# Patient Record
Sex: Female | Born: 1970 | Race: White | Hispanic: Yes | Smoking: Never smoker
Health system: Southern US, Community
[De-identification: ages and names within clinical notes are randomized; demographics above are authoritative.]

## PROBLEM LIST (undated history)

## (undated) DIAGNOSIS — Z9189 Other specified personal risk factors, not elsewhere classified: Secondary | ICD-10-CM

## (undated) DIAGNOSIS — R51 Headache: Secondary | ICD-10-CM

## (undated) DIAGNOSIS — B019 Varicella without complication: Secondary | ICD-10-CM

## (undated) DIAGNOSIS — Z789 Other specified health status: Secondary | ICD-10-CM

## (undated) DIAGNOSIS — T7840XA Allergy, unspecified, initial encounter: Secondary | ICD-10-CM

## (undated) DIAGNOSIS — IMO0001 Reserved for inherently not codable concepts without codable children: Secondary | ICD-10-CM

## (undated) DIAGNOSIS — D649 Anemia, unspecified: Secondary | ICD-10-CM

## (undated) DIAGNOSIS — K4090 Unilateral inguinal hernia, without obstruction or gangrene, not specified as recurrent: Secondary | ICD-10-CM

## (undated) HISTORY — DX: Varicella without complication: B01.9

## (undated) HISTORY — DX: Allergy, unspecified, initial encounter: T78.40XA

## (undated) HISTORY — DX: Other specified personal risk factors, not elsewhere classified: Z91.89

## (undated) HISTORY — PX: HERNIA REPAIR: SHX51

## (undated) HISTORY — PX: SHOULDER ARTHROSCOPY: SHX128

## (undated) HISTORY — PX: MANDIBLE FRACTURE SURGERY: SHX706

---

## 2000-09-04 ENCOUNTER — Other Ambulatory Visit: Admission: RE | Admit: 2000-09-04 | Discharge: 2000-09-04 | Payer: Self-pay | Admitting: Obstetrics and Gynecology

## 2008-09-19 ENCOUNTER — Inpatient Hospital Stay (HOSPITAL_COMMUNITY): Admission: AD | Admit: 2008-09-19 | Discharge: 2008-09-21 | Payer: Self-pay | Admitting: Obstetrics and Gynecology

## 2008-09-23 ENCOUNTER — Encounter: Admission: RE | Admit: 2008-09-23 | Discharge: 2008-10-22 | Payer: Self-pay | Admitting: Obstetrics

## 2008-10-23 ENCOUNTER — Encounter: Admission: RE | Admit: 2008-10-23 | Discharge: 2008-11-22 | Payer: Self-pay | Admitting: Obstetrics

## 2008-11-23 ENCOUNTER — Encounter: Admission: RE | Admit: 2008-11-23 | Discharge: 2008-12-22 | Payer: Self-pay | Admitting: Obstetrics

## 2008-12-23 ENCOUNTER — Encounter: Admission: RE | Admit: 2008-12-23 | Discharge: 2009-01-15 | Payer: Self-pay | Admitting: Obstetrics

## 2009-01-23 ENCOUNTER — Encounter: Admission: RE | Admit: 2009-01-23 | Discharge: 2009-02-22 | Payer: Self-pay | Admitting: Obstetrics

## 2009-02-23 ENCOUNTER — Encounter: Admission: RE | Admit: 2009-02-23 | Discharge: 2009-03-25 | Payer: Self-pay | Admitting: Obstetrics

## 2010-04-23 LAB — CBC
HCT: 41.2 % (ref 36.0–46.0)
Hemoglobin: 14 g/dL (ref 12.0–15.0)
MCHC: 34.1 g/dL (ref 30.0–36.0)
MCHC: 34.3 g/dL (ref 30.0–36.0)
MCV: 97.5 fL (ref 78.0–100.0)
RBC: 3.54 MIL/uL — ABNORMAL LOW (ref 3.87–5.11)
RDW: 13.8 % (ref 11.5–15.5)

## 2010-06-29 LAB — ABO/RH: RH Type: POSITIVE

## 2010-06-29 LAB — RUBELLA ANTIBODY, IGM: Rubella: IMMUNE

## 2010-06-29 LAB — HEPATITIS B SURFACE ANTIGEN: Hepatitis B Surface Ag: NEGATIVE

## 2010-06-29 LAB — ANTIBODY SCREEN: Antibody Screen: NEGATIVE

## 2011-01-04 LAB — STREP B DNA PROBE: GBS: NEGATIVE

## 2011-01-18 NOTE — L&D Delivery Note (Signed)
Delivery Note At 5:45 PM a viable female was delivered via Vaginal, Spontaneous Delivery (Presentation: Right Occiput Anterior).  APGAR: 9, 9; weight .   Placenta status: Intact, Spontaneous.  Cord: 3 vessels with the following complications: .  Cord pH: n/a  Anesthesia: Epidural  Episiotomy: none Lacerations: 2nd degree, external to hymenal ring Suture Repair: 2.0 vicryl rapide Est. Blood Loss (mL): 350 cc  Mom to postpartum.  Baby to nursery-stable.  Jerri Glauser A. 02/02/2011, 6:08 PM

## 2011-02-02 ENCOUNTER — Inpatient Hospital Stay (HOSPITAL_COMMUNITY)
Admission: AD | Admit: 2011-02-02 | Discharge: 2011-02-04 | DRG: 373 | Disposition: A | Discharge status: Home / Self Care | Payer: BC Managed Care – PPO | Source: Ambulatory Visit | Attending: Obstetrics and Gynecology | Admitting: Obstetrics and Gynecology

## 2011-02-02 ENCOUNTER — Inpatient Hospital Stay (HOSPITAL_COMMUNITY): Payer: BC Managed Care – PPO | Admitting: Anesthesiology

## 2011-02-02 ENCOUNTER — Encounter (HOSPITAL_COMMUNITY): Payer: Self-pay | Admitting: Anesthesiology

## 2011-02-02 ENCOUNTER — Encounter (HOSPITAL_COMMUNITY): Payer: Self-pay | Admitting: *Deleted

## 2011-02-02 DIAGNOSIS — IMO0001 Reserved for inherently not codable concepts without codable children: Secondary | ICD-10-CM

## 2011-02-02 DIAGNOSIS — O09529 Supervision of elderly multigravida, unspecified trimester: Secondary | ICD-10-CM | POA: Diagnosis present

## 2011-02-02 HISTORY — DX: Reserved for inherently not codable concepts without codable children: IMO0001

## 2011-02-02 HISTORY — DX: Other specified health status: Z78.9

## 2011-02-02 LAB — CBC
Hemoglobin: 14 g/dL (ref 12.0–15.0)
RBC: 4.38 MIL/uL (ref 3.87–5.11)
WBC: 10 10*3/uL (ref 4.0–10.5)

## 2011-02-02 LAB — TYPE AND SCREEN
ABO/RH(D): O POS
Antibody Screen: NEGATIVE

## 2011-02-02 LAB — ABO/RH: ABO/RH(D): O POS

## 2011-02-02 MED ORDER — ONDANSETRON HCL 4 MG/2ML IJ SOLN: 4.0000 mg | Freq: Four times a day (QID) | INTRAMUSCULAR | Status: DC | PRN

## 2011-02-02 MED ORDER — ONDANSETRON HCL 4 MG/2ML IJ SOLN: 4.0000 mg | INTRAMUSCULAR | Status: DC | PRN

## 2011-02-02 MED ORDER — PRENATAL MULTIVITAMIN CH
1.0000 | ORAL_TABLET | Freq: Every day | ORAL | Status: DC
  Administered 2011-02-04: 1 via ORAL
  Filled 2011-02-02 (×2): qty 1

## 2011-02-02 MED ORDER — CITRIC ACID-SODIUM CITRATE 334-500 MG/5ML PO SOLN: 30.0000 mL | ORAL | Status: DC | PRN

## 2011-02-02 MED ORDER — SIMETHICONE 80 MG PO CHEW: 80.0000 mg | CHEWABLE_TABLET | ORAL | Status: DC | PRN

## 2011-02-02 MED ORDER — BISACODYL 10 MG RE SUPP: 10.0000 mg | Freq: Every day | RECTAL | Status: DC | PRN

## 2011-02-02 MED ORDER — IBUPROFEN 600 MG PO TABS
600.0000 mg | ORAL_TABLET | Freq: Four times a day (QID) | ORAL | Status: DC
  Administered 2011-02-02 – 2011-02-04 (×7): 600 mg via ORAL
  Filled 2011-02-02 (×7): qty 1

## 2011-02-02 MED ORDER — EPHEDRINE 5 MG/ML INJ
10.0000 mg | INTRAVENOUS | Status: DC | PRN
  Filled 2011-02-02: qty 4

## 2011-02-02 MED ORDER — FLEET ENEMA 7-19 GM/118ML RE ENEM: 1.0000 | ENEMA | RECTAL | Status: DC | PRN

## 2011-02-02 MED ORDER — WITCH HAZEL-GLYCERIN EX PADS
1.0000 "application " | MEDICATED_PAD | CUTANEOUS | Status: DC | PRN
  Administered 2011-02-02: 1 via TOPICAL

## 2011-02-02 MED ORDER — LIDOCAINE HCL (PF) 1 % IJ SOLN: 30.0000 mL | INTRAMUSCULAR | Status: DC | PRN

## 2011-02-02 MED ORDER — LANOLIN HYDROUS EX OINT: TOPICAL_OINTMENT | CUTANEOUS | Status: DC | PRN

## 2011-02-02 MED ORDER — ONDANSETRON HCL 4 MG PO TABS: 4.0000 mg | ORAL_TABLET | ORAL | Status: DC | PRN

## 2011-02-02 MED ORDER — TETANUS-DIPHTH-ACELL PERTUSSIS 5-2.5-18.5 LF-MCG/0.5 IM SUSP: 0.5000 mL | Freq: Once | INTRAMUSCULAR | Status: DC

## 2011-02-02 MED ORDER — ZOLPIDEM TARTRATE 5 MG PO TABS: 5.0000 mg | ORAL_TABLET | Freq: Every evening | ORAL | Status: DC | PRN

## 2011-02-02 MED ORDER — LACTATED RINGERS IV SOLN: INTRAVENOUS | Status: DC

## 2011-02-02 MED ORDER — OXYCODONE-ACETAMINOPHEN 5-325 MG PO TABS: 2.0000 | ORAL_TABLET | ORAL | Status: DC | PRN

## 2011-02-02 MED ORDER — EPHEDRINE 5 MG/ML INJ: 10.0000 mg | INTRAVENOUS | Status: DC | PRN

## 2011-02-02 MED ORDER — DIPHENHYDRAMINE HCL 50 MG/ML IJ SOLN: 12.5000 mg | INTRAMUSCULAR | Status: DC | PRN

## 2011-02-02 MED ORDER — LACTATED RINGERS IV SOLN
500.0000 mL | Freq: Once | INTRAVENOUS | Status: AC
  Administered 2011-02-02: 1000 mL via INTRAVENOUS

## 2011-02-02 MED ORDER — OXYTOCIN 20 UNITS IN LACTATED RINGERS INFUSION - SIMPLE: 125.0000 mL/h | Freq: Once | INTRAVENOUS | Status: DC

## 2011-02-02 MED ORDER — LACTATED RINGERS IV SOLN: 500.0000 mL | INTRAVENOUS | Status: DC | PRN

## 2011-02-02 MED ORDER — SENNOSIDES-DOCUSATE SODIUM 8.6-50 MG PO TABS
2.0000 | ORAL_TABLET | Freq: Every day | ORAL | Status: DC
  Administered 2011-02-03: 2 via ORAL

## 2011-02-02 MED ORDER — DIPHENHYDRAMINE HCL 25 MG PO CAPS: 25.0000 mg | ORAL_CAPSULE | Freq: Four times a day (QID) | ORAL | Status: DC | PRN

## 2011-02-02 MED ORDER — IBUPROFEN 600 MG PO TABS: 600.0000 mg | ORAL_TABLET | Freq: Four times a day (QID) | ORAL | Status: DC | PRN

## 2011-02-02 MED ORDER — OXYCODONE-ACETAMINOPHEN 5-325 MG PO TABS
1.0000 | ORAL_TABLET | ORAL | Status: DC | PRN
  Filled 2011-02-02: qty 1

## 2011-02-02 MED ORDER — FLEET ENEMA 7-19 GM/118ML RE ENEM: 1.0000 | ENEMA | Freq: Every day | RECTAL | Status: DC | PRN

## 2011-02-02 MED ORDER — FENTANYL 2.5 MCG/ML BUPIVACAINE 1/10 % EPIDURAL INFUSION (WH - ANES)
14.0000 mL/h | INTRAMUSCULAR | Status: DC
  Filled 2011-02-02: qty 60

## 2011-02-02 MED ORDER — BENZOCAINE-MENTHOL 20-0.5 % EX AERO
1.0000 "application " | INHALATION_SPRAY | CUTANEOUS | Status: DC | PRN
  Administered 2011-02-03: 1 via TOPICAL

## 2011-02-02 MED ORDER — DIBUCAINE 1 % RE OINT: 1.0000 "application " | TOPICAL_OINTMENT | RECTAL | Status: DC | PRN

## 2011-02-02 MED ORDER — BENZOCAINE-MENTHOL 20-0.5 % EX AERO
INHALATION_SPRAY | CUTANEOUS | Status: AC
  Administered 2011-02-03: 1 via TOPICAL
  Filled 2011-02-02: qty 56

## 2011-02-02 MED ORDER — PHENYLEPHRINE 40 MCG/ML (10ML) SYRINGE FOR IV PUSH (FOR BLOOD PRESSURE SUPPORT)
80.0000 ug | PREFILLED_SYRINGE | INTRAVENOUS | Status: DC | PRN
  Filled 2011-02-02: qty 5

## 2011-02-02 MED ORDER — SODIUM BICARBONATE 8.4 % IV SOLN
INTRAVENOUS | Status: DC | PRN
  Administered 2011-02-02: 4 mL via EPIDURAL

## 2011-02-02 MED ORDER — PHENYLEPHRINE 40 MCG/ML (10ML) SYRINGE FOR IV PUSH (FOR BLOOD PRESSURE SUPPORT): 80.0000 ug | PREFILLED_SYRINGE | INTRAVENOUS | Status: DC | PRN

## 2011-02-02 MED ORDER — FENTANYL 2.5 MCG/ML BUPIVACAINE 1/10 % EPIDURAL INFUSION (WH - ANES)
INTRAMUSCULAR | Status: DC | PRN
  Administered 2011-02-02: 14 mL/h via EPIDURAL

## 2011-02-02 MED ORDER — OXYTOCIN BOLUS FROM INFUSION
500.0000 mL | Freq: Once | INTRAVENOUS | Status: AC
  Administered 2011-02-02: 500 mL via INTRAVENOUS
  Filled 2011-02-02: qty 500
  Filled 2011-02-02: qty 1000

## 2011-02-02 MED ORDER — ACETAMINOPHEN 325 MG PO TABS: 650.0000 mg | ORAL_TABLET | ORAL | Status: DC | PRN

## 2011-02-02 NOTE — H&P (Signed)
Rebecca Key is a 41 y.o. G2P1001 at [redacted]w[redacted]d presenting for labor. Pt notes onset contractions around 1:30 pm. Good fetal movement, No vaginal bleeding, not leaking fluid. Pt seen in office and was 7 cm and sent for direct admit.  [facility] at [facility] since 8 wks - AMA, nl NT, nl AFP - h/o rapid labor- 5 hrs as primip - PT cervical change - GBS neg - DS 135  OB History    Grav Para Term Preterm Abortions TAB SAB Ect Mult Living   2 1 1       1      Past Medical History  Diagnosis Date  . No pertinent past medical history    Past Surgical History  Procedure Date  . Shoulder arthroscopy   . Mandible fracture surgery   . Hernia repair   abdominal hernia repair at 41 yrs old.  Family History: family history is not on file. Social History:  reports that she has never smoked. She has never used smokeless tobacco. She reports that she does not drink alcohol or use illicit drugs.  Review of Systems - Negative except contractions     Height 5\' 7"  (1.702 m), weight 77.111 kg (170 lb).  Physical Exam:  Gen: well appearing, no distress CV: RRR Pulm: CTAB Back: no CVAT Abd: gravid, NT, no RUQ pain LE: tr edema, equal bilaterally, non-tender Toco: q 4 min FH: baseline 120s, accelerations present, no deceleratons, 10 beat variability Cvx: 7 cm/ vtx in office at 2:30 per RT  Prenatal labs: ABO, Rh: O/Positive/-- (06/12 0000) Antibody: Negative (06/12 0000) Rubella:  immune RPR: Nonreactive (06/12 0000)  HBsAg: Negative (06/12 0000)  HIV: Non-reactive (06/12 0000)  GBS: Negative (12/18 0000)  1 hr Glucola 135  Genetic screening neg (nl NT, nl AFP) Anatomy US normal   Assessment/Plan: 41 y.o. G2P1001 at [redacted]w[redacted]d Labor- expectant management at this time GBS neg Reactive fetal testing Undecided about epidural Planning cord blood donation  Rebecca Key A. 02/02/2011 3:23 PM     Rebecca Key A. 02/02/2011, 3:19 PM

## 2011-02-02 NOTE — Anesthesia Preprocedure Evaluation (Signed)

## 2011-02-02 NOTE — Anesthesia Procedure Notes (Signed)

## 2011-02-03 ENCOUNTER — Encounter (HOSPITAL_COMMUNITY): Payer: Self-pay

## 2011-02-03 LAB — CBC
Hemoglobin: 11.2 g/dL — ABNORMAL LOW (ref 12.0–15.0)
MCH: 31.6 pg (ref 26.0–34.0)
MCHC: 33.9 g/dL (ref 30.0–36.0)
Platelets: 116 10*3/uL — ABNORMAL LOW (ref 150–400)
RBC: 3.54 MIL/uL — ABNORMAL LOW (ref 3.87–5.11)

## 2011-02-03 LAB — RPR: RPR Ser Ql: NONREACTIVE

## 2011-02-03 NOTE — Progress Notes (Signed)
PPD# 1   S: + amb, + void, min lochia, pain controlled, tol po, overall well  O:  Filed Vitals:   02/02/11 2020 02/02/11 2130 02/03/11 0130 02/03/11 0524  BP: 111/78 109/69 102/64 103/63  Pulse: 62 63 64 60  Temp: 98 F (36.7 C) 98.1 F (36.7 C) 97.8 F (36.6 C) 98.3 F (36.8 C)  TempSrc:    Oral  Resp: 20 20 18 18   Height:      Weight:      SpO2:       Gen: well appearing, CV: RRR Pulm: CTAB Abd: soft, NT, ND, fundus below umbilicus LE: NT, no edema  CBC    Component Value Date/Time   WBC 11.2* 02/03/2011 0652   RBC 3.54* 02/03/2011 0652   HGB 11.2* 02/03/2011 0652   HCT 33.0* 02/03/2011 0652   PLT 116* 02/03/2011 0652   MCV 93.2 02/03/2011 0652   MCH 31.6 02/03/2011 0652   MCHC 33.9 02/03/2011 0652   RDW 13.3 02/03/2011 0652    A/P: PPD # 1 - doing well - routine PP care - circ this pm  Pinki Rottman A. 02/03/2011 10:21 AM

## 2011-02-04 MED ORDER — IBUPROFEN 600 MG PO TABS: 600.0000 mg | ORAL_TABLET | Freq: Four times a day (QID) | ORAL | Status: AC

## 2011-02-04 MED ORDER — OXYCODONE-ACETAMINOPHEN 5-325 MG PO TABS: 1.0000 | ORAL_TABLET | Freq: Four times a day (QID) | ORAL | Status: AC | PRN

## 2011-02-04 NOTE — Discharge Summary (Signed)
Obstetric Discharge Summary Reason for Admission: onset of labor and term gestation at 102wks Prenatal Procedures: ultrasound Intrapartum Procedures: spontaneous vaginal delivery Postpartum Procedures: none Complications-Operative and Postpartum: 2nd degree perineal laceration Hemoglobin  Date Value Range Status  02/03/2011 11.2* 12.0-15.0 (g/dL) Final     DELTA CHECK NOTED     REPEATED TO VERIFY     HCT  Date Value Range Status  02/03/2011 33.0* 36.0-46.0 (%) Final    Discharge Diagnoses: Term Pregnancy-delivered  Discharge Information: Date: 02/04/2011 Activity: pelvic rest Diet: routine Medications: Ibuprofen and Percocet Condition: stable Instructions: refer to practice specific booklet Discharge to: home   Newborn Data: Live born female on 02/02/11 Birth Weight: 8 lb 1.6 oz (3675 g) APGAR: 9, 9  Home with mother.  [name] K 02/04/2011, 9:43 AM

## 2011-02-04 NOTE — Progress Notes (Signed)
Patient ID: Rebecca Key, female   DOB: 09/10/1970, 41 y.o.   MRN: [id number] PPD # 2  Subjective: Pt reports feeling well and desires d/c home/ Pain controlled with prescription NSAID's including motrin Tolerating po/ Voiding without problems/ No n/v Bleeding is light/ Newborn info:  Information for the patient's newborn:  Dezaria, Methot [[id number]]  female  / circ complete, per Dr Mody/ Feeding: breast    Objective:  VS: Blood pressure 103/56, pulse 65, temperature 98 F (36.7 C), temperature source Oral, resp. rate 17.    Basename 02/03/11 0652 02/02/11 1507  WBC 11.2* 10.0  HGB 11.2* 14.0  HCT 33.0* 40.4  PLT 116* 149*    Blood type: --/--/O POS, O POS (01/16 1556) Rubella: Immune (06/12 0000)    Physical Exam:  General: alert, cooperative and no distress Abdomen: soft, nontender, normal bowel sounds Uterine Fundus: firm, below umbilicus, nontender Perineum: not inspected Lochia: minimal Ext: Homans sign is negative, no sign of DVT and no edema, redness or tenderness in the calves or thighs   A/P: PPD # 2/ [id number] Doing well and stable for discharge home RX: Ibuprofen 600mg  po Q 6 hrs prn pain #30 Refill x 1 Percocet 5/325 1 to 2 po Q 4 hrs prn pain #15 No refill WOB/GYN booklet given Routine pp visit in 6wks  Signed: Arlana Lindau [facility] 02/04/11 0930

## 2011-02-04 NOTE — Progress Notes (Signed)
Pt felt that baby was not getting enough from the breastfeeding and requested to supplement. Pt was given Enfamil and I monitored mother while she fed infant 15 ml of formula.

## 2011-02-07 NOTE — Addendum Note (Signed)
Addendum  created 02/07/11 0804 by Kady Toothaker Edward Valentino Saavedra, MD   Modules edited:Notes Section    

## 2011-02-07 NOTE — Anesthesia Postprocedure Evaluation (Signed)
  Anesthesia Post-op Note  Patient: Rebecca Key  Procedure(s) Performed: * No procedures listed * Patient is awake and responsive. Pain and nausea are reasonably well controlled. Vital signs are stable and clinically acceptable. Oxygen saturation is clinically acceptable. There are no apparent anesthetic complications at this time. Patient is ready for discharge.

## 2011-12-09 ENCOUNTER — Ambulatory Visit (INDEPENDENT_AMBULATORY_CARE_PROVIDER_SITE_OTHER): Payer: BC Managed Care – PPO | Admitting: Family Medicine

## 2011-12-09 VITALS — BP 107/67 | HR 69 | Temp 98.0°F | Resp 16 | Ht 66.0 in | Wt 158.0 lb

## 2011-12-09 DIAGNOSIS — Z0000 Encounter for general adult medical examination without abnormal findings: Secondary | ICD-10-CM

## 2011-12-09 DIAGNOSIS — Z23 Encounter for immunization: Secondary | ICD-10-CM

## 2011-12-09 LAB — POCT CBC
Granulocyte percent: 61.3 %G (ref 37–80)
HCT, POC: 44.2 % (ref 37.7–47.9)
MCV: 94.5 fL (ref 80–97)
POC Granulocyte: 4.2 (ref 2–6.9)
Platelet Count, POC: 227 10*3/uL (ref 142–424)
RBC: 4.68 M/uL (ref 4.04–5.48)

## 2011-12-09 NOTE — Patient Instructions (Addendum)
I will be in touch with you regarding your labs.  Let me know if you back is getting worse, and try to do "superman" exercises a couple of times a day.    Take care and congratulations!

## 2011-12-09 NOTE — Progress Notes (Signed)
[facility] [address], [place] [address] [phone]  Date:  12/09/2011   Name:  Rebecca Key   DOB:  05-02-1970   MRN:  [id number]  PCP:  No primary provider on file.    Chief Complaint: Annual Exam   History of Present Illness:  Rebecca Key is a 41 y.o. very pleasant female patient who presents with the following:  Here today for a CPE.  She delivered a baby boy in January of this year.  She also has a 17 year old son. Her children are doing very well. She does not have any particular concerns today but thought it was time to have a CPE as her mother has had some health problems lately. Her only symptom is some lower back pain which she attributes to carrying her son who weights about 22 lbs already! She is not fasting today   She does not need a pap or breast exam today.    She is no longer nursing.  She did have a Tdap during her pregnancy.    Patient Active Problem List  Diagnosis  . PP care - s/p SVD 1/16    Past Medical History  Diagnosis Date  . No pertinent past medical history   . Active labor 02/02/2011  . PP care - s/p SVD 1/16 02/03/2011  . Allergy     Past Surgical History  Procedure Date  . Shoulder arthroscopy   . Mandible fracture surgery   . Hernia repair     History  Substance Use Topics  . Smoking status: Never Smoker   . Smokeless tobacco: Never Used  . Alcohol Use: No    Family History  Problem Relation Age of Onset  . Heart disease Mother   . Stroke Mother   . Hyperlipidemia Father     Allergies  Allergen Reactions  . Sulfa Antibiotics Swelling    Medication list has been reviewed and updated.  Current Outpatient Prescriptions on File Prior to Visit  Medication Sig Dispense Refill  . levonorgestrel (MIRENA) 20 MCG/24HR IUD 1 each by Intrauterine route once.      . Prenatal Vit-Fe Fumarate-FA (PRENATAL MULTIVITAMIN) TABS Take 1 tablet by mouth daily.        Review of Systems:  As per HPI-  otherwise negative.   Physical Examination: Filed Vitals:   12/09/11 1239  BP: 107/67  Pulse: 69  Temp: 98 F (36.7 C)  Resp: 16   Filed Vitals:   12/09/11 1239  Height: 5\' 6"  (1.676 m)  Weight: 158 lb (71.668 kg)   Body mass index is 25.50 kg/(m^2). Ideal Body Weight: Weight in (lb) to have BMI = 25: 154.6   GEN: WDWN, NAD, Non-toxic, A & O x 3 HEENT: Atraumatic, Normocephalic. Neck supple. No masses, No LAD.  Bilateral TM wnl, oropharynx normal.  PEERL,EOMI.   Ears and Nose: No external deformity. CV: RRR, No M/G/R. No JVD. No thrill. No extra heart sounds. PULM: CTA B, no wheezes, crackles, rhonchi. No retractions. No resp. distress. No accessory muscle use. ABD: S, NT, ND, +BS. No rebound. No HSM. She has mild tenderness of her lower back muscles bilaterally, but normal flexion and extension.   EXTR: No c/c/e. Normal DTR all extremities NEURO Normal gait.  PSYCH: Normally interactive. Conversant. Not depressed or anxious appearing.  Calm demeanor.    Assessment and Plan: 1. Physical exam, annual  POCT CBC, Comprehensive metabolic panel, TSH, LDL cholesterol, direct, Flu vaccine  greater than or equal to 3yo preservative free IM   Normal PE today.  Flu shot.  Will plan further follow- up pending labs.  MSK lower back pain due to carrying her son on her hip and deconditioning.  See patient instructions for more details.      Abbe Amsterdam, MD

## 2011-12-10 ENCOUNTER — Encounter: Payer: Self-pay | Admitting: Family Medicine

## 2011-12-10 LAB — COMPREHENSIVE METABOLIC PANEL
AST: 15 U/L (ref 0–37)
Albumin: 4.3 g/dL (ref 3.5–5.2)
BUN: 9 mg/dL (ref 6–23)
Calcium: 9 mg/dL (ref 8.4–10.5)
Chloride: 105 mEq/L (ref 96–112)
Glucose, Bld: 79 mg/dL (ref 70–99)
Potassium: 4.2 mEq/L (ref 3.5–5.3)
Sodium: 140 mEq/L (ref 135–145)
Total Protein: 6.8 g/dL (ref 6.0–8.3)

## 2012-01-18 HISTORY — PX: VAGINAL PROLAPSE REPAIR: SHX830

## 2012-02-21 ENCOUNTER — Ambulatory Visit: Payer: BC Managed Care – PPO

## 2012-02-21 ENCOUNTER — Ambulatory Visit (INDEPENDENT_AMBULATORY_CARE_PROVIDER_SITE_OTHER): Payer: BC Managed Care – PPO | Admitting: Family Medicine

## 2012-02-21 DIAGNOSIS — M549 Dorsalgia, unspecified: Secondary | ICD-10-CM
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
## 2012-07-02 ENCOUNTER — Ambulatory Visit (INDEPENDENT_AMBULATORY_CARE_PROVIDER_SITE_OTHER): Payer: BC Managed Care – PPO | Admitting: Internal Medicine

## 2012-07-02 VITALS — BP 111/70 | HR 71 | Temp 98.0°F | Resp 16 | Ht 67.0 in | Wt 158.0 lb

## 2012-07-02 DIAGNOSIS — W57XXXA Bitten or stung by nonvenomous insect and other nonvenomous arthropods, initial encounter: Secondary | ICD-10-CM

## 2012-07-02 DIAGNOSIS — T148 Other injury of unspecified body region: Secondary | ICD-10-CM

## 2012-07-02 MED ORDER — FLUOCINONIDE-E 0.05 % EX CREA: TOPICAL_CREAM | Freq: Two times a day (BID) | CUTANEOUS | Status: DC

## 2012-07-02 NOTE — Progress Notes (Signed)
  Subjective:    Patient ID: Rebecca Key, female    DOB: 1970/05/26, 42 y.o.   MRN: [id number]  HPI"bites"? Over the past week has had several random mosquito bites or other insect bites and that lead to a  skin reaction that is more extensive than her past experience Unsure what kind--household members are all ok  Patient Active Problem List   Diagnosis Date Noted  . PP care - s/p SVD 1/16 02/03/2011  infant at home   Review of Systems     Objective:   Physical Exam BP 111/70  Pulse 71  Temp(Src) 98 F (36.7 C)  Resp 16  Ht 5\' 7"  (1.702 m)  Wt 158 lb (71.668 kg)  BMI 24.74 kg/m2  Breastfeeding? No There are several erythematous papules on arms that range in reaction size from millimeters to 3 cm No signs of cellulitis Several small bites on under her right posterior bra strap       Assessment & Plan:  Insect bites with allergic reaction  Lidex/avoidance

## 2012-07-17 ENCOUNTER — Other Ambulatory Visit: Payer: Self-pay | Admitting: Radiology

## 2012-07-17 ENCOUNTER — Other Ambulatory Visit: Payer: Self-pay | Admitting: Internal Medicine

## 2012-07-17 NOTE — Telephone Encounter (Signed)
Patient has had flea bites, is trying to get rid of the fleas, but would like refill on the cream lidex/ pended please advise

## 2013-01-25 ENCOUNTER — Ambulatory Visit (INDEPENDENT_AMBULATORY_CARE_PROVIDER_SITE_OTHER): Payer: BC Managed Care – PPO | Admitting: Physician Assistant

## 2013-01-25 VITALS — BP 112/76 | HR 71 | Temp 98.0°F | Resp 16 | Ht 66.0 in | Wt 158.0 lb

## 2013-01-25 DIAGNOSIS — J329 Chronic sinusitis, unspecified: Secondary | ICD-10-CM

## 2013-01-25 DIAGNOSIS — J3489 Other specified disorders of nose and nasal sinuses: Secondary | ICD-10-CM

## 2013-01-25 DIAGNOSIS — R0981 Nasal congestion: Secondary | ICD-10-CM

## 2013-01-25 MED ORDER — GUAIFENESIN ER 1200 MG PO TB12: 1.0000 | ORAL_TABLET | Freq: Two times a day (BID) | ORAL | Status: DC | PRN

## 2013-01-25 MED ORDER — IPRATROPIUM BROMIDE 0.03 % NA SOLN: 2.0000 | Freq: Two times a day (BID) | NASAL | Status: DC

## 2013-01-25 MED ORDER — AMOXICILLIN-POT CLAVULANATE 875-125 MG PO TABS: 1.0000 | ORAL_TABLET | Freq: Two times a day (BID) | ORAL | Status: DC

## 2013-01-25 NOTE — Progress Notes (Signed)
   Subjective:    Patient ID: Rebecca Key, female    DOB: 08/30/1970, [age] y.o.   MRN: [id number]  HPI 43 year old female presents for evaluation of possible sinus infection.  States symptoms have been going on for about 10 days and have been progressively getting worse.  Admits to sinus pain on the left side of her face that radiates to her left ear and is also causing all of her upper teeth on that side to hurt.  Admits to intermittent episodes of yellow/green nasal discharge, but often has times where she is unable to get any out.  She has been taking ibuprofen which does help some with the pain. Denies fever, chills, nausea, vomiting, headache, sore throat, or cough.  Patient is otherwise doing well with no other concerns today.     Review of Systems  Constitutional: Negative for fever and chills.  HENT: Positive for congestion and ear pain (left side). Negative for postnasal drip, rhinorrhea and sore throat.   Respiratory: Negative for cough.   Gastrointestinal: Negative for nausea and vomiting.  Neurological: Negative for headaches.       Objective:   Physical Exam  Constitutional: She is oriented to person, place, and time. She appears well-developed and well-nourished.  HENT:  Head: Normocephalic and atraumatic.  Right Ear: Hearing, tympanic membrane, external ear and ear canal normal.  Left Ear: Hearing, tympanic membrane, external ear and ear canal normal.  Nose: Right sinus exhibits no maxillary sinus tenderness and no frontal sinus tenderness. Left sinus exhibits maxillary sinus tenderness. Left sinus exhibits no frontal sinus tenderness.  Mouth/Throat: Uvula is midline, oropharynx is clear and moist and mucous membranes are normal. No oropharyngeal exudate.  Eyes: Conjunctivae are normal.  Neck: Normal range of motion. Neck supple.  Cardiovascular: Normal rate and normal heart sounds.   Pulmonary/Chest: Effort normal and breath sounds normal.  Lymphadenopathy:    She  has no cervical adenopathy.  Neurological: She is alert and oriented to person, place, and time.  Psychiatric: She has a normal mood and affect. Her behavior is normal. Judgment and thought content normal.          Assessment & Plan:  Sinus infection - Plan: amoxicillin-clavulanate (AUGMENTIN) 875-125 MG per tablet  Nasal congestion - Plan: ipratropium (ATROVENT) 0.03 % nasal spray, Guaifenesin (MUCINEX MAXIMUM STRENGTH) 1200 MG TB12  Will treat with Augmentin 875 mg bid x 10 days Atrovent NS twice daily to help with congestion and ETD Increase fluids and rest Mucinex 1200 mg twice daily Follow up if symptoms worsen or fail to improve.

## 2013-02-01 ENCOUNTER — Telehealth: Payer: Self-pay

## 2013-02-01 ENCOUNTER — Ambulatory Visit (INDEPENDENT_AMBULATORY_CARE_PROVIDER_SITE_OTHER): Payer: BC Managed Care – PPO | Admitting: Family Medicine

## 2013-02-01 VITALS — BP 102/70 | HR 86 | Temp 98.1°F | Resp 18 | Ht 66.0 in | Wt 159.0 lb

## 2013-02-01 DIAGNOSIS — J3489 Other specified disorders of nose and nasal sinuses: Secondary | ICD-10-CM

## 2013-02-01 DIAGNOSIS — R0981 Nasal congestion: Secondary | ICD-10-CM

## 2013-02-01 DIAGNOSIS — R52 Pain, unspecified: Secondary | ICD-10-CM

## 2013-02-01 LAB — POCT INFLUENZA A/B
INFLUENZA B, POC: NEGATIVE
Influenza A, POC: NEGATIVE

## 2013-02-01 MED ORDER — GUAIFENESIN ER 600 MG PO TB12: 600.0000 mg | ORAL_TABLET | Freq: Two times a day (BID) | ORAL | Status: DC

## 2013-02-01 NOTE — Progress Notes (Signed)
[facility] [address], [place] [address] [phone]  Date:  02/01/2013   Name:  Rebecca Key   DOB:  12/07/1970   MRN:  [id number]  PCP:  Robley FriesMODY,VAISHALI R, MD    Chief Complaint: Follow-up   History of Present Illness:  Rebecca Key is a 43 y.o. very pleasant female patient who presents with the following:  She was here 1 week ago with x of a possible sinus infection.  She was treated with augmentin 875 BID for 10 days.   She has been taking this regularly but last night she noted chills. She did not have a fever when she checked her temp.  She has also noted some body aches. She does not have a cough.   She has noted more sinus drainage over the last couple of days. She is not sure if her sinus infection is getting better as it should.  No GI symptoms She is taking advil- just took some a little while ago She had noted tooth pain; this is now a bit better She has noted a headache in her teeth and cheeks- it is now more in her forehead.   She has an IUD She did get a flu shot this year.   She is generally quite healthy.    Patient Active Problem List   Diagnosis Date Noted  . PP care - s/p SVD 1/16 02/03/2011    Past Medical History  Diagnosis Date  . No pertinent past medical history   . Active labor 02/02/2011  . PP care - s/p SVD 1/16 02/03/2011  . Allergy     Past Surgical History  Procedure Laterality Date  . Shoulder arthroscopy    . Mandible fracture surgery    . Hernia repair      History  Substance Use Topics  . Smoking status: Never Smoker   . Smokeless tobacco: Never Used  . Alcohol Use: No    Family History  Problem Relation Age of Onset  . Heart disease Mother   . Stroke Mother   . Hyperlipidemia Father     Allergies  Allergen Reactions  . Sulfa Antibiotics Swelling    Medication list has been reviewed and updated.  Current Outpatient Prescriptions on File Prior to Visit  Medication Sig Dispense  Refill  . amoxicillin-clavulanate (AUGMENTIN) 875-125 MG per tablet Take 1 tablet by mouth 2 (two) times daily.  20 tablet  0  . ipratropium (ATROVENT) 0.03 % nasal spray Place 2 sprays into the nose 2 (two) times daily.  30 mL  0  . levonorgestrel (MIRENA) 20 MCG/24HR IUD 1 each by Intrauterine route once.      . fluocinonide-emollient (LIDEX-E) 0.05 % cream Apply topically 2 (two) times daily.  30 g  0  . Guaifenesin (MUCINEX MAXIMUM STRENGTH) 1200 MG TB12 Take 1 tablet (1,200 mg total) by mouth every 12 (twelve) hours as needed.  14 tablet  0   No current facility-administered medications on file prior to visit.    Review of Systems:  As per HPI- otherwise negative.   Physical Examination: Filed Vitals:   02/01/13 1321  BP: 102/70  Pulse: 86  Temp: 98.1 F (36.7 C)  Resp: 18   Filed Vitals:   02/01/13 1321  Height: 5\' 6"  (1.676 m)  Weight: 159 lb (72.122 kg)   Body mass index is 25.68 kg/(m^2). Ideal Body Weight: Weight in (lb) to have BMI = 25: 154.6  GEN: WDWN,  NAD, Non-toxic, A & O x 3 HEENT: Atraumatic, Normocephalic. Neck supple. No masses, No LAD.  Bilateral TM wnl, oropharynx normal.  PEERL,EOMI.  Sinuses are not particularly tender to percussion.  Nasal cavity is congested.   Ears and Nose: No external deformity. CV: RRR, No M/G/R. No JVD. No thrill. No extra heart sounds. PULM: CTA B, no wheezes, crackles, rhonchi. No retractions. No resp. distress. No accessory muscle use. ABD: S, NT, ND, +BS. No rebound. No HSM. EXTR: No c/c/e NEURO Normal gait.  PSYCH: Normally interactive. Conversant. Not depressed or anxious appearing.  Calm demeanor.   Results for orders placed in visit on 02/01/13  POCT INFLUENZA A/B      Result Value Range   Influenza A, POC Negative     Influenza B, POC Negative      Assessment and Plan: Body aches - Plan: POCT Influenza A/B  Sinus congestion - Plan: guaiFENesin (MUCINEX) 600 MG 12 hr tablet  Verlean is here today for a  recheck.  She was not sure if her current progress was normal.  Reassurance that sinus infection sx do take some time to remit.  Also, she may still have the flu on top of her sinus infection. Discussed and offered more testing such as a CBC or sinus films. She does not desire any further testing currently.  At this time she does not wish to start tamiflu or any other rx medication, although she would like an rx for her mucinex so she can use her FSA.    Signed Abbe Amsterdam, MD

## 2013-02-01 NOTE — Patient Instructions (Addendum)
You can use OTC medication such as tylenol and/ or advil as needed for pain and fever.  You can also use mucinex as needed for congestion.  Let me know if you are not better in the next few days- Sooner if worse.

## 2013-04-09 ENCOUNTER — Other Ambulatory Visit: Payer: Self-pay | Admitting: Obstetrics & Gynecology

## 2013-04-10 ENCOUNTER — Encounter (HOSPITAL_COMMUNITY): Payer: Self-pay | Admitting: Pharmacist

## 2013-04-12 NOTE — Patient Instructions (Signed)
Your procedure is scheduled on: Thursday, April 18, 2013  [facility] at: 7:30 am  Pick up the phone at the desk and dial [phone].  Call this number if you have problems the morning of surgery: [phone].  Remember: Do NOT eat food:  After midnight Wednesday Do NOT drink clear liquids after: After midnight Wednesday Take these medicines the morning of surgery with a SIP OF WATER: None  Do NOT wear jewelry (body piercing), metal hair clips/bobby pins, make-up, or nail polish. Do NOT wear lotions, powders, or perfumes.  You may wear deoderant. Do NOT shave for 48 hours prior to surgery. Do NOT bring valuables to the hospital. Contacts, dentures, or bridgework may not be worn into surgery. Have a responsible adult drive you home and stay with you for 24 hours after your procedure

## 2013-04-15 ENCOUNTER — Encounter (HOSPITAL_COMMUNITY): Payer: Self-pay

## 2013-04-15 ENCOUNTER — Encounter (HOSPITAL_COMMUNITY)
Admission: RE | Admit: 2013-04-15 | Discharge: 2013-04-15 | Disposition: A | Discharge status: Home / Self Care | Payer: BC Managed Care – PPO | Source: Ambulatory Visit | Attending: Obstetrics & Gynecology | Admitting: Obstetrics & Gynecology

## 2013-04-15 HISTORY — DX: Headache: R51

## 2013-04-15 HISTORY — DX: Unilateral inguinal hernia, without obstruction or gangrene, not specified as recurrent: K40.90

## 2013-04-15 HISTORY — DX: Anemia, unspecified: D64.9

## 2013-04-15 LAB — CBC WITH DIFFERENTIAL/PLATELET
BASOS ABS: 0 10*3/uL (ref 0.0–0.1)
Basophils Relative: 1 % (ref 0–1)
Eosinophils Absolute: 0.2 10*3/uL (ref 0.0–0.7)
Eosinophils Relative: 2 % (ref 0–5)
HEMATOCRIT: 40.2 % (ref 36.0–46.0)
Hemoglobin: 13.8 g/dL (ref 12.0–15.0)
LYMPHS PCT: 32 % (ref 12–46)
Lymphs Abs: 2.5 10*3/uL (ref 0.7–4.0)
MCH: 31.2 pg (ref 26.0–34.0)
MCHC: 34.3 g/dL (ref 30.0–36.0)
MCV: 90.7 fL (ref 78.0–100.0)
Monocytes Absolute: 0.7 10*3/uL (ref 0.1–1.0)
Monocytes Relative: 9 % (ref 3–12)
NEUTROS ABS: 4.5 10*3/uL (ref 1.7–7.7)
Neutrophils Relative %: 57 % (ref 43–77)
PLATELETS: 242 10*3/uL (ref 150–400)
RBC: 4.43 MIL/uL (ref 3.87–5.11)
RDW: 12.9 % (ref 11.5–15.5)
WBC: 7.9 10*3/uL (ref 4.0–10.5)

## 2013-04-15 LAB — URINALYSIS, ROUTINE W REFLEX MICROSCOPIC
Bilirubin Urine: NEGATIVE
Glucose, UA: NEGATIVE mg/dL
Ketones, ur: NEGATIVE mg/dL
LEUKOCYTES UA: NEGATIVE
NITRITE: NEGATIVE
PH: 6.5 (ref 5.0–8.0)
Protein, ur: NEGATIVE mg/dL
SPECIFIC GRAVITY, URINE: 1.01 (ref 1.005–1.030)
Urobilinogen, UA: 0.2 mg/dL (ref 0.0–1.0)

## 2013-04-15 LAB — URINE MICROSCOPIC-ADD ON

## 2013-04-17 MED ORDER — CEFAZOLIN SODIUM-DEXTROSE 2-3 GM-% IV SOLR
2.0000 g | INTRAVENOUS | Status: DC
Start: 2013-04-18 — End: 2013-04-18

## 2013-04-18 ENCOUNTER — Encounter (HOSPITAL_COMMUNITY): Payer: BC Managed Care – PPO | Admitting: Anesthesiology

## 2013-04-18 ENCOUNTER — Encounter (HOSPITAL_COMMUNITY)
Admission: RE | Disposition: A | Discharge status: Home / Self Care | Payer: Self-pay | Source: Ambulatory Visit | Attending: Obstetrics & Gynecology

## 2013-04-18 ENCOUNTER — Encounter (HOSPITAL_COMMUNITY): Payer: Self-pay | Admitting: Anesthesiology

## 2013-04-18 ENCOUNTER — Ambulatory Visit (HOSPITAL_COMMUNITY): Payer: BC Managed Care – PPO | Admitting: Anesthesiology

## 2013-04-18 ENCOUNTER — Ambulatory Visit (HOSPITAL_COMMUNITY)
Admission: RE | Admit: 2013-04-18 | Discharge: 2013-04-18 | Disposition: A | Discharge status: Home / Self Care | Payer: BC Managed Care – PPO | Source: Ambulatory Visit | Attending: Obstetrics & Gynecology | Admitting: Obstetrics & Gynecology

## 2013-04-18 DIAGNOSIS — R51 Headache: Secondary | ICD-10-CM | POA: Insufficient documentation

## 2013-04-18 DIAGNOSIS — N393 Stress incontinence (female) (male): Secondary | ICD-10-CM | POA: Insufficient documentation

## 2013-04-18 DIAGNOSIS — N812 Incomplete uterovaginal prolapse: Secondary | ICD-10-CM | POA: Insufficient documentation

## 2013-04-18 DIAGNOSIS — IMO0002 Reserved for concepts with insufficient information to code with codable children: Secondary | ICD-10-CM

## 2013-04-18 DIAGNOSIS — IMO0001 Reserved for inherently not codable concepts without codable children: Secondary | ICD-10-CM | POA: Diagnosis present

## 2013-04-18 DIAGNOSIS — D649 Anemia, unspecified: Secondary | ICD-10-CM | POA: Insufficient documentation

## 2013-04-18 HISTORY — PX: RECTOCELE REPAIR: SHX761

## 2013-04-18 HISTORY — PX: CYSTOCELE REPAIR: SHX163

## 2013-04-18 HISTORY — PX: BLADDER SUSPENSION: SHX72

## 2013-04-18 HISTORY — PX: CYSTOSCOPY: SHX5120

## 2013-04-18 LAB — PREGNANCY, URINE: Preg Test, Ur: NEGATIVE

## 2013-04-18 SURGERY — COLPORRHAPHY, ANTERIOR, FOR CYSTOCELE REPAIR
Anesthesia: General | Site: Vagina

## 2013-04-18 MED ORDER — KETOROLAC TROMETHAMINE 30 MG/ML IJ SOLN
INTRAMUSCULAR | Status: AC
  Filled 2013-04-18: qty 1

## 2013-04-18 MED ORDER — OXYCODONE-ACETAMINOPHEN 5-325 MG PO TABS: 1.0000 | ORAL_TABLET | ORAL | Status: DC | PRN

## 2013-04-18 MED ORDER — FLUMAZENIL 0.5 MG/5ML IV SOLN
INTRAVENOUS | Status: DC | PRN
  Administered 2013-04-18: 0.2 mg via INTRAVENOUS

## 2013-04-18 MED ORDER — DEXAMETHASONE SODIUM PHOSPHATE 10 MG/ML IJ SOLN
INTRAMUSCULAR | Status: DC | PRN
  Administered 2013-04-18: 10 mg via INTRAVENOUS

## 2013-04-18 MED ORDER — PROPOFOL 10 MG/ML IV BOLUS
INTRAVENOUS | Status: DC | PRN
  Administered 2013-04-18: 180 mg via INTRAVENOUS
  Administered 2013-04-18: 20 mg via INTRAVENOUS

## 2013-04-18 MED ORDER — MEPERIDINE HCL 25 MG/ML IJ SOLN: 6.2500 mg | INTRAMUSCULAR | Status: DC | PRN

## 2013-04-18 MED ORDER — ESTRADIOL 0.1 MG/GM VA CREA
TOPICAL_CREAM | VAGINAL | Status: AC
  Filled 2013-04-18: qty 42.5

## 2013-04-18 MED ORDER — MENTHOL 3 MG MT LOZG: 1.0000 | LOZENGE | OROMUCOSAL | Status: DC | PRN

## 2013-04-18 MED ORDER — PHENYLEPHRINE HCL 10 MG/ML IJ SOLN
INTRAMUSCULAR | Status: DC | PRN
  Administered 2013-04-18: 40 ug via INTRAVENOUS
  Administered 2013-04-18: 80 ug via INTRAVENOUS
  Administered 2013-04-18: 40 ug via INTRAVENOUS

## 2013-04-18 MED ORDER — OXYCODONE-ACETAMINOPHEN 5-325 MG PO TABS
1.0000 | ORAL_TABLET | ORAL | Status: DC | PRN
  Administered 2013-04-18 (×2): 2 via ORAL
  Filled 2013-04-18 (×2): qty 2

## 2013-04-18 MED ORDER — FENTANYL CITRATE 0.05 MG/ML IJ SOLN
INTRAMUSCULAR | Status: AC
  Filled 2013-04-18: qty 2

## 2013-04-18 MED ORDER — ONDANSETRON HCL 4 MG/2ML IJ SOLN
INTRAMUSCULAR | Status: DC | PRN
  Administered 2013-04-18: 4 mg via INTRAVENOUS

## 2013-04-18 MED ORDER — KETOROLAC TROMETHAMINE 30 MG/ML IJ SOLN
INTRAMUSCULAR | Status: DC | PRN
  Administered 2013-04-18 (×2): 30 mg via INTRAVENOUS

## 2013-04-18 MED ORDER — FENTANYL CITRATE 0.05 MG/ML IJ SOLN
25.0000 ug | INTRAMUSCULAR | Status: DC | PRN
  Administered 2013-04-18: 25 ug via INTRAVENOUS

## 2013-04-18 MED ORDER — SODIUM CHLORIDE 0.9 % IJ SOLN
INTRAMUSCULAR | Status: AC
  Filled 2013-04-18: qty 50

## 2013-04-18 MED ORDER — SODIUM CHLORIDE 0.9 % IJ SOLN: INTRAMUSCULAR | Status: DC | PRN

## 2013-04-18 MED ORDER — PROPOFOL 10 MG/ML IV EMUL
INTRAVENOUS | Status: AC
  Filled 2013-04-18: qty 20

## 2013-04-18 MED ORDER — ONDANSETRON HCL 4 MG PO TABS: 4.0000 mg | ORAL_TABLET | Freq: Four times a day (QID) | ORAL | Status: DC | PRN

## 2013-04-18 MED ORDER — LIDOCAINE HCL (CARDIAC) 20 MG/ML IV SOLN
INTRAVENOUS | Status: DC | PRN
  Administered 2013-04-18: 40 mg via INTRAVENOUS
  Administered 2013-04-18: 60 mg via INTRAVENOUS

## 2013-04-18 MED ORDER — PHENYLEPHRINE 40 MCG/ML (10ML) SYRINGE FOR IV PUSH (FOR BLOOD PRESSURE SUPPORT)
PREFILLED_SYRINGE | INTRAVENOUS | Status: AC
  Filled 2013-04-18: qty 5

## 2013-04-18 MED ORDER — EPHEDRINE SULFATE 50 MG/ML IJ SOLN
INTRAMUSCULAR | Status: DC | PRN
  Administered 2013-04-18: 5 mg via INTRAVENOUS

## 2013-04-18 MED ORDER — VASOPRESSIN 20 UNIT/ML IJ SOLN
INTRAMUSCULAR | Status: AC
  Filled 2013-04-18: qty 1

## 2013-04-18 MED ORDER — METOCLOPRAMIDE HCL 5 MG/ML IJ SOLN: 10.0000 mg | Freq: Once | INTRAMUSCULAR | Status: DC | PRN

## 2013-04-18 MED ORDER — MIDAZOLAM HCL 2 MG/2ML IJ SOLN
INTRAMUSCULAR | Status: DC | PRN
Start: 2013-04-18 — End: 2013-04-18
  Administered 2013-04-18: 2 mg via INTRAVENOUS

## 2013-04-18 MED ORDER — DOCUSATE SODIUM 100 MG PO CAPS: 100.0000 mg | ORAL_CAPSULE | Freq: Two times a day (BID) | ORAL | Status: DC

## 2013-04-18 MED ORDER — KETOROLAC TROMETHAMINE 30 MG/ML IJ SOLN: 30.0000 mg | Freq: Once | INTRAMUSCULAR | Status: DC

## 2013-04-18 MED ORDER — VASOPRESSIN 20 UNIT/ML IJ SOLN
INTRAVENOUS | Status: DC | PRN
  Administered 2013-04-18 (×2): via INTRAMUSCULAR

## 2013-04-18 MED ORDER — ESTRADIOL 0.1 MG/GM VA CREA
TOPICAL_CREAM | VAGINAL | Status: DC | PRN
  Administered 2013-04-18: 1 via VAGINAL

## 2013-04-18 MED ORDER — STERILE WATER FOR IRRIGATION IR SOLN
Status: DC | PRN
  Administered 2013-04-18: 1000 mL

## 2013-04-18 MED ORDER — FENTANYL CITRATE 0.05 MG/ML IJ SOLN
INTRAMUSCULAR | Status: DC | PRN
  Administered 2013-04-18 (×3): 50 ug via INTRAVENOUS
  Administered 2013-04-18: 25 ug via INTRAVENOUS

## 2013-04-18 MED ORDER — DEXAMETHASONE SODIUM PHOSPHATE 10 MG/ML IJ SOLN
INTRAMUSCULAR | Status: AC
  Filled 2013-04-18: qty 1

## 2013-04-18 MED ORDER — ONDANSETRON HCL 4 MG/2ML IJ SOLN: 4.0000 mg | Freq: Four times a day (QID) | INTRAMUSCULAR | Status: DC | PRN

## 2013-04-18 MED ORDER — LIDOCAINE HCL (CARDIAC) 20 MG/ML IV SOLN
INTRAVENOUS | Status: AC
  Filled 2013-04-18: qty 5

## 2013-04-18 MED ORDER — MIDAZOLAM HCL 2 MG/2ML IJ SOLN
INTRAMUSCULAR | Status: AC
  Filled 2013-04-18: qty 2

## 2013-04-18 MED ORDER — BENZOCAINE-MENTHOL 20-0.5 % EX AERO
1.0000 "application " | INHALATION_SPRAY | Freq: Four times a day (QID) | CUTANEOUS | Status: DC | PRN
  Administered 2013-04-18: 1 via TOPICAL
  Filled 2013-04-18: qty 56

## 2013-04-18 MED ORDER — METHYLENE BLUE 1 % INJ SOLN
INTRAMUSCULAR | Status: AC
  Filled 2013-04-18: qty 10

## 2013-04-18 MED ORDER — CEFAZOLIN SODIUM-DEXTROSE 2-3 GM-% IV SOLR
INTRAVENOUS | Status: AC
  Administered 2013-04-18: 2 g via INTRAVENOUS
  Filled 2013-04-18: qty 50

## 2013-04-18 MED ORDER — IBUPROFEN 600 MG PO TABS: 600.0000 mg | ORAL_TABLET | Freq: Four times a day (QID) | ORAL | Status: DC | PRN

## 2013-04-18 MED ORDER — EPHEDRINE 5 MG/ML INJ
INTRAVENOUS | Status: AC
  Filled 2013-04-18: qty 10

## 2013-04-18 MED ORDER — ONDANSETRON HCL 4 MG/2ML IJ SOLN
INTRAMUSCULAR | Status: AC
  Filled 2013-04-18: qty 2

## 2013-04-18 MED ORDER — LACTATED RINGERS IV SOLN
INTRAVENOUS | Status: DC
  Administered 2013-04-18 (×3): via INTRAVENOUS

## 2013-04-18 SURGICAL SUPPLY — 38 items
ADH SKN CLS APL DERMABOND .7 (GAUZE/BANDAGES/DRESSINGS) ×4
BAG URINE DRAINAGE (UROLOGICAL SUPPLIES) ×5 IMPLANT
BLADE SURG 15 STRL LF C SS BP (BLADE) ×8 IMPLANT
BLADE SURG 15 STRL SS (BLADE) ×10
CANISTER SUCT 3000ML (MISCELLANEOUS) ×5 IMPLANT
CATH FOLEY 2WAY SLVR  5CC 16FR (CATHETERS) ×1
CATH FOLEY 2WAY SLVR  5CC 18FR (CATHETERS) ×1
CATH FOLEY 2WAY SLVR 5CC 16FR (CATHETERS) ×4 IMPLANT
CATH FOLEY 2WAY SLVR 5CC 18FR (CATHETERS) ×4 IMPLANT
CLOTH BEACON ORANGE TIMEOUT ST (SAFETY) ×5 IMPLANT
CONT PATH 16OZ SNAP LID 3702 (MISCELLANEOUS) IMPLANT
DECANTER SPIKE VIAL GLASS SM (MISCELLANEOUS) IMPLANT
DERMABOND ADVANCED (GAUZE/BANDAGES/DRESSINGS) ×1
DERMABOND ADVANCED .7 DNX12 (GAUZE/BANDAGES/DRESSINGS) ×4 IMPLANT
DRAPE HYSTEROSCOPY (DRAPE) ×5 IMPLANT
GAUZE PACKING 1 X5 YD ST (GAUZE/BANDAGES/DRESSINGS) IMPLANT
GAUZE PACKING 2X5 YD STERILE (GAUZE/BANDAGES/DRESSINGS) ×1 IMPLANT
GLOVE BIO SURGEON STRL SZ7 (GLOVE) ×5 IMPLANT
GLOVE BIOGEL PI IND STRL 7.0 (GLOVE) ×4 IMPLANT
GLOVE BIOGEL PI INDICATOR 7.0 (GLOVE) ×1
GOWN STRL REUS W/TWL LRG LVL3 (GOWN DISPOSABLE) ×20 IMPLANT
NEEDLE HYPO 22GX1.5 SAFETY (NEEDLE) ×5 IMPLANT
NS IRRIG 1000ML POUR BTL (IV SOLUTION) ×5 IMPLANT
PACK VAGINAL WOMENS (CUSTOM PROCEDURE TRAY) ×5 IMPLANT
RETRACTOR STAY HOOK 5MM (MISCELLANEOUS) IMPLANT
SET CYSTO W/LG BORE CLAMP LF (SET/KITS/TRAYS/PACK) ×5 IMPLANT
SLING TVT EXACT (Sling) ×1 IMPLANT
SUT CHROMIC 0 CT 1 (SUTURE) ×1 IMPLANT
SUT CHROMIC 2 0 SH (SUTURE) ×13 IMPLANT
SUT VIC AB 0 CT1 27 (SUTURE) ×5
SUT VIC AB 0 CT1 27XBRD ANBCTR (SUTURE) IMPLANT
SUT VIC AB 2-0 SH 27 (SUTURE) ×15
SUT VIC AB 2-0 SH 27XBRD (SUTURE) ×4 IMPLANT
SUT VIC AB 3-0 SH 27 (SUTURE)
SUT VIC AB 3-0 SH 27X BRD (SUTURE) IMPLANT
TOWEL OR 17X24 6PK STRL BLUE (TOWEL DISPOSABLE) ×10 IMPLANT
TRAY FOLEY CATH 14FR (SET/KITS/TRAYS/PACK) ×5 IMPLANT
WATER STERILE IRR 1000ML POUR (IV SOLUTION) ×4 IMPLANT

## 2013-04-18 NOTE — Discharge Instructions (Addendum)
Tension Free Vaginal Tape System, Care After Please read the instructions below. Refer to these instructions for the next few weeks. These instructions provide you with general information on caring for yourself after your operation. Your caregiver may also give you specific instructions. While your treatment has been planned according to the most current medical practices available, unavoidable problems sometimes occur. Discomfort from the operation area is normal for a couple of weeks. If you have any questions or problems after discharge, please call your caregiver. HOME CARE INSTRUCTIONS   Take your prescribed medications as directed by your caregiver.  You may take over-the-counter medication for minor pain with your caregiver's recommendation.  You may resume your usual diet.  Do not take aspirin. It can cause bleeding.  It is helpful to have a responsible person with you for a few days or a week after the surgery.  Shower or bath as directed.  Do not lift anything over 5 pounds.  Change your dressing as directed.  Do not drive until your caregiver says it is OK.  Do not have sexual intercourse until your caregiver says it is OK.  Do not use tampons.  You may take a laxative with your caregiver's recommendation.  You may take sitz baths 2 to 3 times a day with your caregiver's advice.  Make and keep your postoperative appointments. SEEK MEDICAL CARE IF:   There is increasing pain in the wound area.  There is swelling or redness in the wound area.  You develop abnormal vaginal discharge.  You develop a rash.  You develop nausea, vomiting, constipation or diarrhea.  You think the stitches in the wound are breaking.  You lose urine when you cough.  You have problems with your medications. SEEK IMMEDIATE MEDICAL CARE IF:   You develop a temperature of 102 F (38.9 C) or higher.  You have bleeding from the wound area above the pubic bone or from the  vagina.  You see pus coming from the incisions.  You cannot urinate.  You develop bloody or painful urination.  You pass out.  You develop leg or chest pain.  You develop abdominal pain.  You develop shortness of breath. Document Released: 04/01/2008 Document Revised: 10/24/2012 Document Reviewed: 04/01/2008 [facility] Patient Information 2014 [place], [place].  Cystocele Repair, Care After Refer to this sheet in the next few weeks. These instructions provide you with information on caring for yourself after your procedure. Your health care provider may also give you more specific instructions. Your treatment has been planned according to current medical practices, but problems sometimes occur. Call your health care provider if you have any problems or questions after your procedure. WHAT TO EXPECT AFTER THE PROCEDURE After your procedure, it is typical to have the following:  Bloody discharge from the vagina for 1 2 weeks.  A catheter in your bladder to drain urine as the bladder heals. You will be instructed on how to empty the bag. HOME CARE INSTRUCTIONS   Only take over-the-counter or prescription medicines as directed by your health care provider.  Do not take baths. Take showers until your health care provider tells you otherwise.  Exercise as instructed. Do not perform any exercise that increases the pressure on your abdomen, such as lifting weights or doing sit-ups, until your health care provider has given you permission.  You may resume your normal diet. Eat a well-balanced diet.  Drink enough fluids to keep your urine clear or pale yellow.  Avoid straining during bowel movements.  If you become constipated, you may:  Take a mild laxative if your health care provider approves.  Add fruit and bran to your diet.  Drink more fluids.  Do not douche or have sexual intercourse for 6 weeks after your surgery.  Follow up with your health care provider as directed. SEEK  MEDICAL CARE IF:  You have nausea or vomiting.  You have vaginal pain that is not relieved by pain medicines.  You feel a burning sensation during urination or have frequent urination.  SEEK IMMEDIATE MEDICAL CARE IF:   You have redness, swelling, or a bad-smelling discharge from the vagina.  You notice a bad smell coming from the vagina.  You have pus coming from the vagina.  You have a fever.  You have abdominal pain.  You have excessive vaginal bleeding.  You have shortness of breath or chest pain. Document Released: 07/23/2004 Document Revised: 09/05/2012 Document Reviewed: 06/22/2012 [facility] Patient Information 2014 [place], [place].

## 2013-04-18 NOTE — Op Note (Signed)
04/18/2013 Procedure note: Rebecca Key May 13, 1970  PRE-OPERATIVE DIAGNOSIS: Cystocele grade 3, gaping perineum, stress urinary incontinence  POST-OPERATIVE DIAGNOSIS: Same  PROCEDURE:  Anterior Colporrhaphy, Posterior perineorrhaphy, TVT (exact) mid-urethral sling, Cystoscopy  SURGEON:  Robley Fries, MD   ASSISTANT:  Marlinda Mike, CNM  ANESTHESIA:  General Anesthesia  EBL: 25 cc   IVFluids: 1900 cc LR  Urine Out: 100 cc clear   LOCAL MEDICATIONS USED: 20 cc Pitressin in 50 cc saline  SPECIMEN: none   COUNTS:  YES  PLAN OF CARE: Admit for 6 hrs observation  PATIENT DISPOSITION:  PACU - hemodynamically stable.   Delay start of Pharmacological VTE agent (>24hrs) due to surgical blood loss or risk of bleeding  Procedure:  Indication: 43 yo female, G2P2, with symptomatic cystocele grade 3, grade 1 uterine prolapse, gaping perineum, stress urinary incontinence.  She was brought to the operating room with IV running and received 2 gm Ancef. Time Out carried out and patient was confirmed with procedure as mentioned above. She underwent General Anesthesia without complications.  Anterior Colporrhaphy was performed first. Allis clamps applied to the anterior vaginal wall upto 1 cm from cervicovaginal junction and superiorly upto 3 cm from the urethral meatus. Saline/pitressin infiltration done for dissection. Vaginal incision on the mucosa made with scalpel in inverted T fashion. Dissection performed to separate endopelvic fascia from the vaginal epithelium after making linear incision along the edge with scalpel and then undermining with Strully scissors. Cut edges of vagina were grasped with Allis clamps and connective tissue and endopelvic fascia was dissected away by sharp and blunt dissection from the vaginal epithelium to free it up laterally. Bleeding was cauterlzed. Torn endopelvic fascia was sutured together from two sides in the midline with 2-0 Vicryl in interrupted sutures  and Cystocele was entirely reduced. Excess vaginal epithelium was cut.  Vaginal edges were sutured together in the midline with 2-0 Vicryl while grasping underlying endopelvic fascia in the vertical closure.   TVT sling was performed next.  Mons pubis was marked for the planned bilateral exit points, 2 cm lateral to the midline just above the a pubic symphysis on both sides.  A solution of 20 units Pitressin in 50 cc of normal saline was used as the injection. 20 cc of this solution was injected into the suprapubic space bilaterally with spinal needle from the planned abdominal exit points into the retropubic space. Foley catheter was switched to #18 to allow rigid catheter guide insertion later. Two Allis clamps placed 2 cm below the urethral meatus (in the area of mid-urethra) on either side of the midline. 10 cc of the injecting solution injected between the 2 Allis clamps with lateral spread in the area of vaginal tunnels. Small vertical incision was made on vaginal wall in between the Allis clamps and with Metzenbaum scissors vaginal mucosa dissected to creat tunnels toward retropubic space under the pubic ramus in the direction of ipsilateral shoulder.  A rigid Foley guide was placed through the foley into the bladder. Bladder neck was deviated to the left by moving the guide to patient's right thigh and right retropubic space was targeted. TVT sling plastic sleeve was mounted on the trocar and was inserted into the pre-dissected space and was guided under the pubic bone, and while hugging the bone and then dropping the trocar handle down to direct towards anterior abdominal wall, bringing it out through the premarked right exit incision. A Kelly clamp was placed on the plastic sleeve tip and the trocar  was removed through the vaginal incision. The trocar was placed in the opposite TVT sling sleeve. Bladder neck was deviated to right by moving the foley with guide to the left thigh and opening up left  retropubic space and the trocar was inserted into the left pre-dissected space and was pushed under the pubic bone, and while hugging the bone and dropping the trocar handle down as it was directed to the abdominal wall, bringing it out through the premarked left exit incision. Kelly clamp placed on plastic sleeve and metal trocar removed.  Cystoscopy was performed with saline for irrigation. Bladder was filled to 300 cc and evaluation was done of the entire bladder mucosa and the urethra to ensure no plastic sleeve was seen perforating the bladder. Findings were clear. Bilateral urine efflux noted from ureteral openings, no evidence of bladder injury noted. Cystoscope removed and bladder emptied with foley.  Sling was pulled from both exit sites while leave a Mayo scissors between sling and vaginal space to ensure "tension-free" placement. Plastic covers on sling were removed by gentle pull and excess sling cut at the abdominal exit sites. Good hemostasis was noted. The vaginal mucosa was closed with 3-0 vicryl. Skin exit sites were closed with Dermabond.   Posterior perineorrhaphy was performed next.  Two Allis clamps were placed on the posterior vaginal opening at the perineum about 2 cm each on either side of the midline. Dilute Pitressin solution (5 cc remaining) was injected. Inverted triangle cut made with scalpel at the perineum. Side of the cut were grasped with Allis clamps. Perineal muscles of puborectalis sphincter were grasped with an allis clamp on either side after dissecting around them. 3 sutures of 0-Vicryl were placed in horizontal mattress fashion on the muscle bellies to bring them together in the midline. The cut skin and vaginal mucosa were closed in subcuticular fashion with 2-0 Chromic. Hemostasis was excellent and good perineal support noted.  Estrace cream applied in the vagina, 1 inch vaginal pack inserted for compression. #16 foley was placed with urobag for drainage until vaginal  pack is removed in 4 hrs.   All counts were correct x 2. No complications. Patient will be admitted for 6 hr observation, will remove packing and try voiding trial on the floor.   V.Mehtab Dolberry, MD

## 2013-04-18 NOTE — Anesthesia Preprocedure Evaluation (Signed)
Anesthesia Evaluation  Patient identified by MRN, date of birth, ID band Patient awake    Reviewed: Allergy & Precautions, H&P , NPO status , Patient's Chart, lab work & pertinent test results  Airway Mallampati: II TM Distance: >3 FB Neck ROM: Full    Dental no notable dental hx. (+) Teeth Intact, Caps   Pulmonary neg pulmonary ROS,  breath sounds clear to auscultation  Pulmonary exam normal       Cardiovascular negative cardio ROS  Rhythm:Regular Rate:Normal     Neuro/Psych  Headaches, negative psych ROS   GI/Hepatic negative GI ROS, Neg liver ROS,   Endo/Other  negative endocrine ROS  Renal/GU negative Renal ROS Bladder dysfunction  Cystocele SUI negative genitourinary   Musculoskeletal negative musculoskeletal ROS (+)   Abdominal Normal abdominal exam  (+)   Peds  Hematology  (+) anemia ,   Anesthesia Other Findings   Reproductive/Obstetrics negative OB ROS                           Anesthesia Physical Anesthesia Plan  ASA: II  Anesthesia Plan: General   Post-op Pain Management:    Induction: Intravenous  Airway Management Planned: LMA  Additional Equipment:   Intra-op Plan:   Post-operative Plan: Extubation in OR  Informed Consent: I have reviewed the patients History and Physical, chart, labs and discussed the procedure including the risks, benefits and alternatives for the proposed anesthesia with the patient or authorized representative who has indicated his/her understanding and acceptance.   Dental advisory given  Plan Discussed with: Anesthesiologist, CRNA and Surgeon  Anesthesia Plan Comments:         Anesthesia Quick Evaluation

## 2013-04-18 NOTE — Anesthesia Postprocedure Evaluation (Signed)
  Anesthesia Post-op Note  Patient: Rebecca Key  Procedure(s) Performed: Procedure(s) with comments: ANTERIOR REPAIR (CYSTOCELE); Anterior Colporrhaphy (N/A) - 90 min. TRANSVAGINAL TAPE (TVT) Exact PROCEDURE (N/A) CYSTOSCOPY (N/A) POSTERIOR REPAIR (RECTOCELE)  Patient Location: PACU  Anesthesia Type:General  Level of Consciousness: awake, alert  and oriented  Airway and Oxygen Therapy: Patient Spontanous Breathing  Post-op Pain: mild  Post-op Assessment: Post-op Vital signs reviewed, Patient's Cardiovascular Status Stable, Respiratory Function Stable, Patent Airway, No signs of Nausea or vomiting and Pain level controlled  Post-op Vital Signs: Reviewed and stable  Complications: No apparent anesthesia complications

## 2013-04-18 NOTE — H&P (Signed)
Butler Denmarkdriana Z Lienau is an 43 y.o. female G2P2 with symptomatic cystocele with pelvic pressure, vaginal bulge and severe stress urinary incontinence who desires surgery. Denies abnormal bleeding, has Mirena IUD for contraception and does not desire to have more kids.  SVDs x 2, uncomplicated deliveries, largest baby 8 lbs.  Normal Paps, monogamous. No breast complaints.   No LMP recorded. Patient is not currently having periods (Reason: IUD).    Past Medical History  Diagnosis Date  . No pertinent past medical history   . Active labor 02/02/2011  . PP care - s/p SVD 1/16 02/03/2011  . Allergy   . Inguinal hernia   . Headache(784.0)     occ  . Anemia     with pregnancy    Past Surgical History  Procedure Laterality Date  . Shoulder arthroscopy    . Mandible fracture surgery    . Hernia repair      Family History  Problem Relation Age of Onset  . Heart disease Mother   . Stroke Mother   . Hyperlipidemia Father     Social History:  reports that she has never smoked. She has never used smokeless tobacco. She reports that she drinks alcohol. She reports that she does not use illicit drugs.  Allergies:  Allergies  Allergen Reactions  . Sulfa Antibiotics Swelling    Prescriptions prior to admission  Medication Sig Dispense Refill  . cetirizine (ZYRTEC) 10 MG tablet Take 10 mg by mouth daily.      Marland Kitchen. ibuprofen (ADVIL,MOTRIN) 200 MG tablet Take 200 mg by mouth every 6 (six) hours as needed for headache.      . levonorgestrel (MIRENA) 20 MCG/24HR IUD 1 each by Intrauterine route once.      . loratadine (CLARITIN) 10 MG tablet Take 10 mg by mouth daily as needed for allergies.        Review of Systems  Genitourinary: Negative for dysuria, urgency, frequency, hematuria and flank pain.  All other systems reviewed and are negative.    There were no vitals taken for this visit. Physical Exam A&O x 3, no acute distress. Pleasant HEENT neg, no thyromegaly Lungs CTA bilat CV RRR,  S1S2 normal Abdo soft, non tender, non acute Extr no edema/ tenderness Pelvic Grade 3 Cystocele, urethral hypermobility with stress incontinence, grade 1 uterine prolapse and rectocele.  Assessment/Plan: 43 yo G2P2 with Mirena IUD. Here for surgery for Cystocele and stress urinary incontinence. Deferring hysterectomy after extensive counseling since uterine prolapse is minimal (grade 1) and recovery will be longer with hysterectomy.  Plan- Anterior colporrhaphy, TVT sling and possible perineorrhaphy.  Risks/complications of surgery reviewed incl infection, bleeding, damage to internal organs including bladder, bowels, ureters, blood vessels, other risks from anesthesia, VTE and delayed complications of any surgery, complications in future surgery reviewed. Reviewed risks of TV incl post op retention of urine, voiding dysfunction, de novo urgency, mesh erosion etc. Patient understands and agrees.    Abagayle Klutts R 04/18/2013, 8:01 AM

## 2013-04-18 NOTE — Transfer of Care (Signed)
Immediate Anesthesia Transfer of Care Note  Patient: Rebecca Key  Procedure(s) Performed: Procedure(s) with comments: ANTERIOR REPAIR (CYSTOCELE); Anterior Colporrhaphy (N/A) - 90 min. TRANSVAGINAL TAPE (TVT) Exact PROCEDURE (N/A) CYSTOSCOPY (N/A) POSTERIOR REPAIR (RECTOCELE)  Patient Location: PACU  Anesthesia Type:General  Level of Consciousness: awake, oriented and patient cooperative  Airway & Oxygen Therapy: Patient Spontanous Breathing and Patient connected to nasal cannula oxygen  Post-op Assessment: Report given to PACU RN and Post -op Vital signs reviewed and stable  Post vital signs: Reviewed and stable  Complications: No apparent anesthesia complications

## 2013-04-18 NOTE — Progress Notes (Signed)
Patient discharged home with husband. Discharge instructions reviewed with patient and family, along with use of sitz bath. Questions answered as needed. No home health or equipment needed.Patinet in stable condition. Taken down to main entrance via wheelchair by C. Clearance CootsHarper, RN.

## 2013-04-18 NOTE — Progress Notes (Signed)
Patient ID: Rebecca Key, female   DOB: 01/01/1971, 43 y.o.   MRN: [id number] Subjective: Feels well,. Able to void, PVR normal with bladder scan. Slight vag bleeding. Pain better since using ice on perineum.   Objective: Vital signs in last 24 hours: Temp:  [97.3 F (36.3 C)-98.4 F (36.9 C)] 97.7 F (36.5 C) (04/02 1811) Pulse Rate:  [67-87] 87 (04/02 1811) Resp:  [13-20] 16 (04/02 1811) BP: (95-121)/(46-71) 103/63 mmHg (04/02 1811) SpO2:  [96 %-100 %] 99 % (04/02 1811) Weight:  [163 lb (73.936 kg)] 163 lb (73.936 kg) (04/02 1404) Weight change:  Last BM Date: 04/18/13    Intake/Output this shift: Total I/O In: 746.2 [I.V.:746.2] Out: 345 [Urine:345]  General appearance: alert and cooperative Resp: clear to auscultation bilaterally Cardio: regular rate and rhythm, S1, S2 normal, no murmur, click, rub or gallop Pelvic: vagina normal without discharge and no active bleeding, packing removed Extremities: extremities normal, atraumatic, no cyanosis or edema  Lab Results: No results found for this basename: WBC, HGB, HCT, PLT,  in the last 72 hours BMET No results found for this basename: NA, K, CL, CO2, GLUCOSE, BUN, CREATININE, CALCIUM,  in the last 72 hours  Studies/Results: No results found.  Medications: I have reviewed the patient's current medications.  Assessment/Plan: POD 0, S/p Cystocele repair, perineorrhaphy, TVT sling. Ok to discharge home, see me in office in 2 wks. Post op care reviewed.    LOS: 0 days   Constancia Geeting R 04/18/2013, 8:55 PM

## 2013-04-18 NOTE — Discharge Summary (Signed)
Physician Discharge Summary  Patient ID: Rebecca Key MRN: [id number] DOB/AGE: 41/07/1970 41 y.o.  Admit date: 04/18/2013 Discharge date: 04/18/2013  Admission Diagnoses: Cystocele grade 3, stress urinary incontinence, gaping perineum  Discharge Diagnoses: Same S/p Anterior Colporrhaphy, perineorrhaphy, mid-urethral sling, cystoscopy.   Discharged Condition: good  Hospital Course: Uncomplicated. Post-op able to void well with normal PVR. Perineal pain better.   Discharge Exam: Blood pressure 103/63, pulse 87, temperature 97.7 F (36.5 C), temperature source Oral, resp. rate 16, height 5\' 6"  (1.676 m), weight 163 lb (73.936 kg), SpO2 99.00%. Exam normal   Disposition: 01-Home or Self Care  Discharge Orders   Future Orders Complete By Expires   Call MD for:  difficulty breathing, headache or visual disturbances  As directed    Call MD for:  extreme fatigue  As directed    Call MD for:  hives  As directed    Call MD for:  persistant dizziness or light-headedness  As directed    Call MD for:  persistant nausea and vomiting  As directed    Call MD for:  redness, tenderness, or signs of infection (pain, swelling, redness, odor or green/yellow discharge around incision site)  As directed    Call MD for:  severe uncontrolled pain  As directed    Call MD for:  temperature >100.4  As directed    Call MD for:  As directed    Comments:     Vaginal bleeding like a period   Diet - low sodium heart healthy  As directed    Driving Restrictions  As directed    Comments:     For 2-3 days and ok to drive after that if tolerated   Increase activity slowly  As directed    Lifting restrictions  As directed    Comments:     NO lifting more than 20 lbs for 6 weeks   No wound care  As directed    Nursing communication  As directed    Comments:     Remove foley at 3 pm.  Remove vaginal pack at 4 pm. Measure voided volume and post void residual volume and call Dr Juliene PinaMody   Sexual Activity  Restrictions  As directed    Comments:     No sex for 6 wks       Medication List         cetirizine 10 MG tablet  Commonly known as:  ZYRTEC  Take 10 mg by mouth daily.     docusate sodium 100 MG capsule  Commonly known as:  COLACE  Take 1 capsule (100 mg total) by mouth 2 (two) times daily.     ibuprofen 200 MG tablet  Commonly known as:  ADVIL,MOTRIN  Take 200 mg by mouth every 6 (six) hours as needed for headache.     ibuprofen 600 MG tablet  Commonly known as:  ADVIL,MOTRIN  Take 1 tablet (600 mg total) by mouth every 6 (six) hours as needed.     levonorgestrel 20 MCG/24HR IUD  Commonly known as:  MIRENA  1 each by Intrauterine route once.     loratadine 10 MG tablet  Commonly known as:  CLARITIN  Take 10 mg by mouth daily as needed for allergies.     oxyCODONE-acetaminophen 5-325 MG per tablet  Commonly known as:  PERCOCET/ROXICET  Take 1-2 tablets by mouth every 4 (four) hours as needed for severe pain (moderate to severe pain (when tolerating fluids)).  Follow-up Information   Follow up with Anaira Seay R, MD In 2 weeks.   Specialty:  Obstetrics and Gynecology   Contact information:   [address] [place] [address] [phone]     Post-op care reviewed. Warning signs reviewed.   Signed: Javonne Louissaint R 04/18/2013, 8:54 PM

## 2013-04-19 ENCOUNTER — Encounter (HOSPITAL_COMMUNITY): Payer: Self-pay | Admitting: Obstetrics & Gynecology

## 2013-06-03 ENCOUNTER — Other Ambulatory Visit (HOSPITAL_COMMUNITY): Payer: Self-pay | Admitting: Obstetrics & Gynecology

## 2013-06-03 DIAGNOSIS — Z1231 Encounter for screening mammogram for malignant neoplasm of breast: Secondary | ICD-10-CM

## 2013-06-05 ENCOUNTER — Other Ambulatory Visit: Payer: Self-pay | Admitting: Obstetrics & Gynecology

## 2013-06-05 DIAGNOSIS — Z1231 Encounter for screening mammogram for malignant neoplasm of breast: Secondary | ICD-10-CM

## 2013-07-03 ENCOUNTER — Ambulatory Visit
Admission: RE | Admit: 2013-07-03 | Discharge: 2013-07-03 | Disposition: A | Discharge status: Home / Self Care | Payer: BC Managed Care – PPO | Source: Ambulatory Visit | Attending: Obstetrics & Gynecology | Admitting: Obstetrics & Gynecology

## 2013-07-03 ENCOUNTER — Ambulatory Visit (HOSPITAL_COMMUNITY): Payer: BC Managed Care – PPO

## 2013-07-03 DIAGNOSIS — Z1231 Encounter for screening mammogram for malignant neoplasm of breast: Secondary | ICD-10-CM
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
## 2013-11-18 ENCOUNTER — Encounter (HOSPITAL_COMMUNITY): Payer: Self-pay | Admitting: Obstetrics & Gynecology

## 2014-01-02 ENCOUNTER — Telehealth: Payer: Self-pay | Admitting: Family Medicine

## 2014-01-02 NOTE — Telephone Encounter (Signed)
called her back- she is doing ok, funeral is today.  Her mother fell on 12/5.  She would like to be excused from 12/5- 12/22 (the last day of school before holiday break).  This is reasonable, will have note ready for her tomorrow

## 2014-01-02 NOTE — Telephone Encounter (Signed)
To Dr Copland.   

## 2014-01-02 NOTE — Telephone Encounter (Addendum)
Patient stated the following:  Her mother was placed in the hospital on 12/21/2013 and she passed away on 12/27/2013. The funeral is being held today (01/02/2014) and patient wants to know if Dr. 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 Patient would like to pick this documentation up today if possible. Patient also [facility] is needed. Rebecca Key 07/25/1935.   [phone]

## 2014-01-03 ENCOUNTER — Encounter: Payer: Self-pay | Admitting: Family Medicine

## 2014-02-11 ENCOUNTER — Telehealth: Payer: Self-pay

## 2014-02-11 DIAGNOSIS — Z0271 Encounter for disability determination: Secondary | ICD-10-CM

## 2014-02-11 NOTE — Telephone Encounter (Signed)
Pt dropped off FMLA ppw for Dr. Cyndie Chimeopland's Completion in 5-7 business days on 02/10/14. Please return to Disability Department ,upon completion ,so Jasmine or myself  can scan into system and contact pt for pick up at [phone].

## 2014-02-12 ENCOUNTER — Encounter: Payer: Self-pay | Admitting: Family Medicine

## 2014-02-12 NOTE — Telephone Encounter (Signed)
Completed forms and placed in FMLA box today

## 2014-10-16 ENCOUNTER — Ambulatory Visit (INDEPENDENT_AMBULATORY_CARE_PROVIDER_SITE_OTHER): Payer: BC Managed Care – PPO | Admitting: Family Medicine

## 2014-10-16 VITALS — BP 124/76 | HR 97 | Temp 98.2°F | Resp 17 | Ht 66.5 in | Wt 159.0 lb

## 2014-10-16 DIAGNOSIS — J0100 Acute maxillary sinusitis, unspecified: Secondary | ICD-10-CM

## 2014-10-16 MED ORDER — AMOXICILLIN-POT CLAVULANATE 875-125 MG PO TABS: 1.0000 | ORAL_TABLET | Freq: Two times a day (BID) | ORAL | Status: DC

## 2014-10-16 NOTE — Patient Instructions (Addendum)
We recommend that you schedule a mammogram for breast cancer screening. Typically, you do not need a referral to do this. Please contact a local imaging center to schedule your mammogram.  [facility] - [phone]  *ask for the Radiology Department [facility]) - [phone] or [phone]  [facility] - [phone] [facility] - [phone] [facility] - [phone]  *ask for the Radiology Department [facility] - [phone]  *ask for the Radiology Department [facility] - [phone]  *ask for the Mammography Department [facility] - [phone] Sinusitis Sinusitis is redness, soreness, and inflammation of the paranasal sinuses. Paranasal sinuses are air pockets within the bones of your face (beneath the eyes, the middle of the forehead, or above the eyes). In healthy paranasal sinuses, mucus is able to drain out, and air is able to circulate through them by way of your nose. However, when your paranasal sinuses are inflamed, mucus and air can become trapped. This can allow bacteria and other germs to grow and cause infection. Sinusitis can develop quickly and last only a short time (acute) or continue over a long period (chronic). Sinusitis that lasts for more than 12 weeks is considered chronic.  CAUSES  Causes of sinusitis include:  Allergies.  Structural abnormalities, such as displacement of the cartilage that separates your nostrils (deviated septum), which can decrease the air flow through your nose and sinuses and affect sinus drainage.  Functional abnormalities, such as when the small hairs (cilia) that line your sinuses and help remove mucus do not work properly or are not present. SIGNS AND SYMPTOMS  Symptoms of acute and chronic sinusitis are the same. The primary symptoms are pain and pressure around the affected sinuses. Other symptoms include:  Upper  toothache.  Earache.  Headache.  Bad breath.  Decreased sense of smell and taste.  A cough, which worsens when you are lying flat.  Fatigue.  Fever.  Thick drainage from your nose, which often is green and may contain pus (purulent).  Swelling and warmth over the affected sinuses. DIAGNOSIS  Your health care provider will perform a physical exam. During the exam, your health care provider may:  Look in your nose for signs of abnormal growths in your nostrils (nasal polyps).  Tap over the affected sinus to check for signs of infection.  View the inside of your sinuses (endoscopy) using an imaging device that has a light attached (endoscope). If your health care provider suspects that you have chronic sinusitis, one or more of the following tests may be recommended:  Allergy tests.  Nasal culture. A sample of mucus is taken from your nose, sent to a lab, and screened for bacteria.  Nasal cytology. A sample of mucus is taken from your nose and examined by your health care provider to determine if your sinusitis is related to an allergy. TREATMENT  Most cases of acute sinusitis are related to a viral infection and will resolve on their own within 10 days. Sometimes medicines are prescribed to help relieve symptoms (pain medicine, decongestants, nasal steroid sprays, or saline sprays).  However, for sinusitis related to a bacterial infection, your health care provider will prescribe antibiotic medicines. These are medicines that will help kill the bacteria causing the infection.  Rarely, sinusitis is caused by a fungal infection. In theses cases, your health care provider will prescribe antifungal medicine. For some cases of chronic sinusitis, surgery is needed.  Generally, these are cases in which sinusitis recurs more than 3 times per year, despite other treatments. HOME CARE INSTRUCTIONS   Drink plenty of water. Water helps thin the mucus so your sinuses can drain more  easily.  Use a humidifier.  Inhale steam 3 to 4 times a day (for example, sit in the bathroom with the shower running).  Apply a warm, moist washcloth to your face 3 to 4 times a day, or as directed by your health care provider.  Use saline nasal sprays to help moisten and clean your sinuses.  Take medicines only as directed by your health care provider.  If you were prescribed either an antibiotic or antifungal medicine, finish it all even if you start to feel better. SEEK IMMEDIATE MEDICAL CARE IF:  You have increasing pain or severe headaches.  You have nausea, vomiting, or drowsiness.  You have swelling around your face.  You have vision problems.  You have a stiff neck.  You have difficulty breathing. MAKE SURE YOU:   Understand these instructions.  Will watch your condition.  Will get help right away if you are not doing well or get worse. Document Released: 01/03/2005 Document Revised: 05/20/2013 Document Reviewed: 01/18/2011 [facility] Patient Information 2015 [place], [place]. This information is not intended to replace advice given to you by your health care provider. Make sure you discuss any questions you have with your health care provider.

## 2014-10-16 NOTE — Progress Notes (Signed)
° °  Subjective:    Patient ID: Rebecca Key, female    DOB: 1970-07-02, 44 y.o.   MRN: [id number] This chart was scribed for Elvina Sidle, MD by Littie Deeds, Medical Scribe. This patient was seen in Room 8 and the patient's care was started at 2:12 PM.   HPI HPI Comments: Rebecca Key is a 44 y.o. female who presents to the [facility] complaining of gradual onset minimally productive cough that started 5 days ago. Patient reports having associated ear fullness and congestion. She did have a fever and generalized myalgias 4 days ago, and she notes that she had a fever again last night. She coughed up mucus with blood one time which concerned her. Patient has allergies to sulfa antibiotics.  Patient works as a Clinical biochemist.  PCP: Dr. Juliene Pina  Review of Systems  Constitutional: Positive for fever.  HENT: Positive for congestion.   Respiratory: Positive for cough.   Musculoskeletal: Positive for myalgias.       Objective:   Physical Exam CONSTITUTIONAL: Well developed/well nourished HEAD: Normocephalic/atraumatic EYES: EOM/PERRL ENMT: Mucous membranes moist, with bilateral nasal passage swelling and mucopurulent discharge. Normal TMs with exception of slight erythema on the malleolus bilaterally, normal oropharynx NECK: supple no meningeal signs SPINE: entire spine nontender CV: S1/S2 noted, no murmurs/rubs/gallops noted LUNGS: Lungs are clear to auscultation bilaterally, no apparent distress ABDOMEN: soft, nontender, no rebound or guarding GU: no cva tenderness NEURO: Pt is awake/alert, moves all extremitiesx4 EXTREMITIES: pulses normal, full ROM SKIN: warm, color normal PSYCH: no abnormalities of mood noted        Assessment & Plan:   By signing my name below, I, Littie Deeds, attest that this documentation has been prepared under the direction and in the presence of Elvina Sidle, MD.  Electronically Signed: Littie Deeds, Medical Scribe.  10/16/2014. 2:12 PM.  This chart was scribed in my presence and reviewed by me personally.    ICD-9-CM ICD-10-CM   1. Acute maxillary sinusitis, recurrence not specified 461.0 J01.00 amoxicillin-clavulanate (AUGMENTIN) 875-125 MG tablet     Signed, Elvina Sidle, MD

## 2015-02-06 ENCOUNTER — Encounter: Payer: Self-pay | Admitting: Family Medicine

## 2015-11-18 ENCOUNTER — Ambulatory Visit (INDEPENDENT_AMBULATORY_CARE_PROVIDER_SITE_OTHER): Payer: BC Managed Care – PPO | Admitting: Family Medicine

## 2015-11-18 VITALS — BP 114/74 | HR 73 | Temp 98.7°F | Resp 17 | Ht 66.5 in | Wt 162.0 lb

## 2015-11-18 DIAGNOSIS — J069 Acute upper respiratory infection, unspecified: Secondary | ICD-10-CM

## 2015-11-18 NOTE — Progress Notes (Signed)
By signing my name below, I, Mesha Guinyard, attest that this documentation has been prepared under the direction and in the presence of Meredith StaggersJeffrey Greene, MD.  Electronically Signed: Arvilla MarketMesha Guinyard, Medical Scribe. 11/18/15. 4:12 PM.  Subjective:    Patient ID: Rebecca Key, female    DOB: 01/06/1971, 45 y.o.   MRN: [id number]  HPI Chief Complaint  Patient presents with   Nasal Congestion    HPI Comments: Rebecca Key is a 45 y.o. female who presents to the [facility] complaining of congestion onset 6 days ago. Reports associated symptoms of myalgia, chest congestion, nausea from postnasal drip, wet cough with yellow to green colored sputum, and one day of subjective fever 6 days ago. Pt's 2 kids received their flu vaccine last week. Taken advil, and mucinex for relief of symptoms.  Patient Active Problem List   Diagnosis Date Noted   Cystocele, grade 3 04/18/2013   PP care - s/p SVD 1/16 02/03/2011   Past Medical History:  Diagnosis Date   Active labor 02/02/2011   Allergy    Anemia    with pregnancy   Headache(784.0)    occ   Inguinal hernia    No pertinent past medical history    PP care - s/p SVD 1/16 02/03/2011   Vaginal delivery 2010, 2013   Past Surgical History:  Procedure Laterality Date   BLADDER SUSPENSION N/A 04/18/2013   Procedure: TRANSVAGINAL TAPE (TVT) Exact PROCEDURE;  Surgeon: Robley FriesVaishali R Mody, MD;  Location: [facility];  Service: Gynecology;  Laterality: N/A;   CYSTOCELE REPAIR N/A 04/18/2013   Procedure: ANTERIOR REPAIR (CYSTOCELE); Anterior Colporrhaphy;  Surgeon: Robley FriesVaishali R Mody, MD;  Location: [facility];  Service: Gynecology;  Laterality: N/A;  90 min.   CYSTOSCOPY N/A 04/18/2013   Procedure: CYSTOSCOPY;  Surgeon: Robley FriesVaishali R Mody, MD;  Location: [facility];  Service: Gynecology;  Laterality: N/A;   HERNIA REPAIR     MANDIBLE FRACTURE SURGERY     RECTOCELE REPAIR  04/18/2013   Procedure: POSTERIOR REPAIR (RECTOCELE);  Surgeon:  Robley FriesVaishali R Mody, MD;  Location: [facility];  Service: Gynecology;;   SHOULDER ARTHROSCOPY     Allergies  Allergen Reactions   Sulfa Antibiotics Swelling   Prior to Admission medications   Medication Sig Start Date End Date Taking? Authorizing Provider  cetirizine (ZYRTEC) 10 MG tablet Take 10 mg by mouth daily.    Historical Provider, MD  ibuprofen (ADVIL,MOTRIN) 200 MG tablet Take 200 mg by mouth every 6 (six) hours as needed for headache.    Historical Provider, MD  levonorgestrel (MIRENA) 20 MCG/24HR IUD 1 each by Intrauterine route once.    Historical Provider, MD  loratadine (CLARITIN) 10 MG tablet Take 10 mg by mouth daily as needed for allergies.    Historical Provider, MD   Social History   Social History   Marital status: Married    Spouse name: N/A   Number of children: N/A   Years of education: N/A   Occupational History   Not on file.   Social History Main Topics   Smoking status: Never Smoker   Smokeless tobacco: Never Used   Alcohol use Yes     Comment: occassional   Drug use: No   Sexual activity: Yes    Birth control/ protection: IUD   Other Topics Concern   Not on file   Social History Narrative   No narrative on file   Review of Systems  Constitutional: Positive for fever.  HENT: Positive for congestion and postnasal drip.   Respiratory: Positive for cough.   Gastrointestinal: Positive for nausea.  Musculoskeletal: Positive for myalgias.   Objective:  Physical Exam  Constitutional: She is oriented to person, place, and time. She appears well-developed and well-nourished. No distress.  HENT:  Head: Normocephalic and atraumatic.  Right Ear: Hearing, tympanic membrane, external ear and ear canal normal.  Left Ear: Hearing, tympanic membrane, external ear and ear canal normal.  Nose: Nose normal.  Mouth/Throat: Oropharynx is clear and moist. No oropharyngeal exudate or posterior oropharyngeal erythema.  Eyes: Conjunctivae and EOM are  normal. Pupils are equal, round, and reactive to light.  Cardiovascular: Normal rate, regular rhythm, normal heart sounds and intact distal pulses.   No murmur heard. Pulmonary/Chest: Effort normal and breath sounds normal. No respiratory distress. She has no wheezes. She has no rhonchi.  Lymphadenopathy:    She has no cervical adenopathy.  Neurological: She is alert and oriented to person, place, and time.  Skin: Skin is warm and dry. No rash noted.  Psychiatric: She has a normal mood and affect. Her behavior is normal.  Vitals reviewed.  BP 114/74 (BP Location: Left Arm, Patient Position: Sitting, Cuff Size: Normal)    Pulse 73    Temp 98.7 F (37.1 C) (Oral)    Resp 17    Ht 5' 6.5" (1.689 m)    Wt 162 lb (73.5 kg)    SpO2 98%    BMI 25.76 kg/m  Assessment & Plan:   Rebecca Key is a 45 y.o. female Acute upper respiratory infection  - Suspected viral syndrome. Afebrile, reassuring exam. Symptomatic care discussed, but if persistent purulent nasal discharge//pressure in next 5 days, consider Augmentin for early sinusitis. Advised if increased chest symptoms including fever/shortness breath, return for recheck. rtc precautions.   No orders of the defined types were placed in this encounter.  Patient Instructions   Your symptoms appear to be due to a virus at this point. Saline or salt water nasal spray 4-5 times per day, Mucinex as needed, make sure you drink plenty of fluids and rest as needed. If the sinus symptoms including discolored nasal discharge are not improving into next week, call and I can call in an antibiotic for a possible sinus infection at that time. If you're short of breath or have return of fever, return for recheck.  Return to the clinic or go to the nearest emergency room if any of your symptoms worsen or new symptoms occur.   Upper Respiratory Infection, Adult Most upper respiratory infections (URIs) are a viral infection of the air passages leading to the  lungs. A URI affects the nose, throat, and upper air passages. The most common type of URI is nasopharyngitis and is typically referred to as "the common cold." URIs run their course and usually go away on their own. Most of the time, a URI does not require medical attention, but sometimes a bacterial infection in the upper airways can follow a viral infection. This is called a secondary infection. Sinus and middle ear infections are common types of secondary upper respiratory infections. Bacterial pneumonia can also complicate a URI. A URI can worsen asthma and chronic obstructive pulmonary disease (COPD). Sometimes, these complications can require emergency medical care and may be life threatening.  CAUSES Almost all URIs are caused by viruses. A virus is a type of germ and can spread from one person to another.  RISKS FACTORS You may be at risk  for a URI if:   You smoke.   You have chronic heart or lung disease.  You have a weakened defense (immune) system.   You are very young or very old.   You have nasal allergies or asthma.  You work in crowded or poorly ventilated areas.  You work in health care facilities or schools. SIGNS AND SYMPTOMS  Symptoms typically develop 2-3 days after you come in contact with a cold virus. Most viral URIs last 7-10 days. However, viral URIs from the influenza virus (flu virus) can last 14-18 days and are typically more severe. Symptoms may include:   Runny or stuffy (congested) nose.   Sneezing.   Cough.   Sore throat.   Headache.   Fatigue.   Fever.   Loss of appetite.   Pain in your forehead, behind your eyes, and over your cheekbones (sinus pain).  Muscle aches.  DIAGNOSIS  Your health care provider may diagnose a URI by:  Physical exam.  Tests to check that your symptoms are not due to another condition such as:  Strep throat.  Sinusitis.  Pneumonia.  Asthma. TREATMENT  A URI goes away on its own with time.  It cannot be cured with medicines, but medicines may be prescribed or recommended to relieve symptoms. Medicines may help:  Reduce your fever.  Reduce your cough.  Relieve nasal congestion. HOME CARE INSTRUCTIONS   Take medicines only as directed by your health care provider.   Gargle warm saltwater or take cough drops to comfort your throat as directed by your health care provider.  Use a warm mist humidifier or inhale steam from a shower to increase air moisture. This may make it easier to breathe.  Drink enough fluid to keep your urine clear or pale yellow.   Eat soups and other clear broths and maintain good nutrition.   Rest as needed.   Return to work when your temperature has returned to normal or as your health care provider advises. You may need to stay home longer to avoid infecting others. You can also use a face mask and careful hand washing to prevent spread of the virus.  Increase the usage of your inhaler if you have asthma.   Do not use any tobacco products, including cigarettes, chewing tobacco, or electronic cigarettes. If you need help quitting, ask your health care provider. PREVENTION  The best way to protect yourself from getting a cold is to practice good hygiene.   Avoid oral or hand contact with people with cold symptoms.   Wash your hands often if contact occurs.  There is no clear evidence that vitamin C, vitamin E, echinacea, or exercise reduces the chance of developing a cold. However, it is always recommended to get plenty of rest, exercise, and practice good nutrition.  SEEK MEDICAL CARE IF:   You are getting worse rather than better.   Your symptoms are not controlled by medicine.   You have chills.  You have worsening shortness of breath.  You have brown or red mucus.  You have yellow or brown nasal discharge.  You have pain in your face, especially when you bend forward.  You have a fever.  You have swollen neck  glands.  You have pain while swallowing.  You have white areas in the back of your throat. SEEK IMMEDIATE MEDICAL CARE IF:   You have severe or persistent:  Headache.  Ear pain.  Sinus pain.  Chest pain.  You have chronic lung disease  and any of the following:  Wheezing.  Prolonged cough.  Coughing up blood.  A change in your usual mucus.  You have a stiff neck.  You have changes in your:  Vision.  Hearing.  Thinking.  Mood. MAKE SURE YOU:   Understand these instructions.  Will watch your condition.  Will get help right away if you are not doing well or get worse.   This information is not intended to replace advice given to you by your health care provider. Make sure you discuss any questions you have with your health care provider.   Document Released: 06/29/2000 Document Revised: 05/20/2014 Document Reviewed: 04/10/2013 Elsevier Interactive Patient Education 2016 [employer].   IF you received an x-ray today, you will receive an invoice from [facility]. Please contact [facility] at [phone] with questions or concerns regarding your invoice.   IF you received labwork today, you will receive an invoice from [employer]. Please contact [facility] at [phone] with questions or concerns regarding your invoice.   Our billing staff will not be able to assist you with questions regarding bills from these companies.  You will be contacted with the lab results as soon as they are available. The fastest way to get your results is to activate your My Chart account. Instructions are located on the last page of this paperwork. If you have not heard from [place] regarding the results in 2 weeks, please contact this office.       I personally performed the services described in this documentation, which was scribed in my presence. The recorded information has been reviewed and considered, and addended by me as needed.    Signed,   Meredith Staggers, MD [facility].  11/18/15 4:59 PM

## 2015-11-18 NOTE — Patient Instructions (Addendum)
Your symptoms appear to be due to a virus at this point. Saline or salt water nasal spray 4-5 times per day, Mucinex as needed, make sure you drink plenty of fluids and rest as needed. If the sinus symptoms including discolored nasal discharge are not improving into next week, call and I can call in an antibiotic for a possible sinus infection at that time. If you're short of breath or have return of fever, return for recheck.  Return to the clinic or go to the nearest emergency room if any of your symptoms worsen or new symptoms occur.   Upper Respiratory Infection, Adult Most upper respiratory infections (URIs) are a viral infection of the air passages leading to the lungs. A URI affects the nose, throat, and upper air passages. The most common type of URI is nasopharyngitis and is typically referred to as "the common cold." URIs run their course and usually go away on their own. Most of the time, a URI does not require medical attention, but sometimes a bacterial infection in the upper airways can follow a viral infection. This is called a secondary infection. Sinus and middle ear infections are common types of secondary upper respiratory infections. Bacterial pneumonia can also complicate a URI. A URI can worsen asthma and chronic obstructive pulmonary disease (COPD). Sometimes, these complications can require emergency medical care and may be life threatening.  CAUSES Almost all URIs are caused by viruses. A virus is a type of germ and can spread from one person to another.  RISKS FACTORS You may be at risk for a URI if:   You smoke.   You have chronic heart or lung disease.  You have a weakened defense (immune) system.   You are very young or very old.   You have nasal allergies or asthma.  You work in crowded or poorly ventilated areas.  You work in health care facilities or schools. SIGNS AND SYMPTOMS  Symptoms typically develop 2-3 days after you come in contact with a cold  virus. Most viral URIs last 7-10 days. However, viral URIs from the influenza virus (flu virus) can last 14-18 days and are typically more severe. Symptoms may include:   Runny or stuffy (congested) nose.   Sneezing.   Cough.   Sore throat.   Headache.   Fatigue.   Fever.   Loss of appetite.   Pain in your forehead, behind your eyes, and over your cheekbones (sinus pain).  Muscle aches.  DIAGNOSIS  Your health care provider may diagnose a URI by:  Physical exam.  Tests to check that your symptoms are not due to another condition such as:  Strep throat.  Sinusitis.  Pneumonia.  Asthma. TREATMENT  A URI goes away on its own with time. It cannot be cured with medicines, but medicines may be prescribed or recommended to relieve symptoms. Medicines may help:  Reduce your fever.  Reduce your cough.  Relieve nasal congestion. HOME CARE INSTRUCTIONS   Take medicines only as directed by your health care provider.   Gargle warm saltwater or take cough drops to comfort your throat as directed by your health care provider.  Use a warm mist humidifier or inhale steam from a shower to increase air moisture. This may make it easier to breathe.  Drink enough fluid to keep your urine clear or pale yellow.   Eat soups and other clear broths and maintain good nutrition.   Rest as needed.   Return to work when your temperature  has returned to normal or as your health care provider advises. You may need to stay home longer to avoid infecting others. You can also use a face mask and careful hand washing to prevent spread of the virus.  Increase the usage of your inhaler if you have asthma.   Do not use any tobacco products, including cigarettes, chewing tobacco, or electronic cigarettes. If you need help quitting, ask your health care provider. PREVENTION  The best way to protect yourself from getting a cold is to practice good hygiene.   Avoid oral or hand  contact with people with cold symptoms.   Wash your hands often if contact occurs.  There is no clear evidence that vitamin C, vitamin E, echinacea, or exercise reduces the chance of developing a cold. However, it is always recommended to get plenty of rest, exercise, and practice good nutrition.  SEEK MEDICAL CARE IF:   You are getting worse rather than better.   Your symptoms are not controlled by medicine.   You have chills.  You have worsening shortness of breath.  You have brown or red mucus.  You have yellow or brown nasal discharge.  You have pain in your face, especially when you bend forward.  You have a fever.  You have swollen neck glands.  You have pain while swallowing.  You have white areas in the back of your throat. SEEK IMMEDIATE MEDICAL CARE IF:   You have severe or persistent:  Headache.  Ear pain.  Sinus pain.  Chest pain.  You have chronic lung disease and any of the following:  Wheezing.  Prolonged cough.  Coughing up blood.  A change in your usual mucus.  You have a stiff neck.  You have changes in your:  Vision.  Hearing.  Thinking.  Mood. MAKE SURE YOU:   Understand these instructions.  Will watch your condition.  Will get help right away if you are not doing well or get worse.   This information is not intended to replace advice given to you by your health care provider. Make sure you discuss any questions you have with your health care provider.   Document Released: 06/29/2000 Document Revised: 05/20/2014 Document Reviewed: 04/10/2013 Elsevier Interactive Patient Education 2016 [employer]   IF you received an x-ray today, you will receive an invoice from [facility]. Please contact [facility] at [phone] with questions or concerns regarding your invoice.   IF you received labwork today, you will receive an invoice from [facility]. Please contact [facility] at  [phone] with questions or concerns regarding your invoice.   Our billing staff will not be able to assist you with questions regarding bills from these companies.  You will be contacted with the lab results as soon as they are available. The fastest way to get your results is to activate your My Chart account. Instructions are located on the last page of this paperwork. If you have not heard from us regarding the results in 2 weeks, please contact this office.

## 2016-09-25 ENCOUNTER — Ambulatory Visit (INDEPENDENT_AMBULATORY_CARE_PROVIDER_SITE_OTHER): Payer: Self-pay | Admitting: Family Medicine

## 2016-09-25 ENCOUNTER — Encounter: Payer: Self-pay | Admitting: Family Medicine

## 2016-09-25 VITALS — BP 114/78 | HR 80 | Temp 98.2°F | Resp 16 | Ht 67.0 in | Wt 161.4 lb

## 2016-09-25 DIAGNOSIS — J029 Acute pharyngitis, unspecified: Secondary | ICD-10-CM

## 2016-09-25 DIAGNOSIS — R0982 Postnasal drip: Secondary | ICD-10-CM

## 2016-09-25 DIAGNOSIS — R0981 Nasal congestion: Secondary | ICD-10-CM

## 2016-09-25 LAB — POCT RAPID STREP A (OFFICE): Rapid Strep A Screen: NEGATIVE

## 2016-09-25 MED ORDER — IPRATROPIUM BROMIDE 0.03 % NA SOLN: 2.0000 | Freq: Two times a day (BID) | NASAL | 0 refills | Status: DC

## 2016-09-25 NOTE — Progress Notes (Signed)
Patient ID: Rebecca Key, female    DOB: 07/13/1970, 46 y.o.   MRN: [id number]  PCP: Rebecca Key, Vaishali, MD  Chief Complaint  Patient presents with  . Sore Throat    1 day ago  . Nasal Congestion    1 day ago      Subjective:  HPI Rebecca Key is a 46 y.o. female presents for evaluation of sore throat and nasal congestions. Rebecca Key reports that her son was diagnosed with streptococcal throat infection a little over 1 week ago. Today, Rebecca Key reports development of sore throat and nasal congestion x 1 day. She has a history of environmental allergies and occasional treats with OTC antihistamines. Denies associated fever, congestion, abdominal pain, nausea, or vomiting. Of note she having repair work completed within her home and has had recent exposure to excessive amounts of dust. 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 Social History   Social History  . Marital status: Married    Spouse name: N/A  . Number of children: N/A  . Years of education: N/A   Occupational History  . Not on file.   Social History Main Topics  . Smoking status: Never Smoker  . Smokeless tobacco: Never Used  . Alcohol use Yes     Comment: occassional  . Drug use: No  . Sexual activity: Yes    Birth control/ protection: IUD   Other Topics Concern  . Not on file   Social History Narrative  . No narrative on file    Family History  Problem Relation Age of Onset  . Heart disease Mother   . Stroke Mother   . Hyperlipidemia Father    Review of Systems See HPI  Patient Active Problem List   Diagnosis Date Noted  . Cystocele, grade 3 04/18/2013  . PP care - s/p SVD 1/16 02/03/2011    Allergies  Allergen Reactions  . Sulfa Antibiotics Swelling    Prior to Admission medications   Medication Sig Start Date End Date Taking? Authorizing Provider  cetirizine (ZYRTEC) 10 MG tablet Take 10 mg by mouth daily.    [provider]  ibuprofen (ADVIL,MOTRIN) 200 MG tablet Take 200 mg by mouth every 6 (six) hours as needed for headache.    [provider]  levonorgestrel (MIRENA) 20 MCG/24HR IUD 1 each by Intrauterine route once.    [provider]  loratadine (CLARITIN) 10 MG tablet Take 10 mg by mouth daily as needed for allergies.    [provider]    Past Medical, Surgical Family and Social History reviewed and updated.    Objective:  There were no vitals filed for this visit.  Wt Readings from Last 3 Encounters:  11/18/15 162 lb (73.5 kg)  10/16/14 159 lb (72.1 kg)  04/18/13 163 lb (73.9 kg)   Physical Exam  Constitutional: She appears well-developed and well-nourished. No distress.  HENT:  Right Ear: Hearing, tympanic membrane, external ear and ear canal normal.  Left Ear: Hearing, tympanic membrane, external ear and ear canal normal.  Nose: Rhinorrhea present.  Mouth/Throat: Uvula is midline, oropharynx is clear and moist and mucous membranes are normal. No oropharyngeal exudate, posterior oropharyngeal edema, posterior oropharyngeal erythema or tonsillar abscesses.  Eyes: Pupils are equal, round, and reactive to light. Conjunctivae and EOM are normal.  Neck: Normal range of motion. Neck supple.  Cardiovascular: Normal rate, regular rhythm, normal heart sounds and intact distal  pulses.   Pulmonary/Chest: Effort normal and breath sounds normal.  Lymphadenopathy:    She has no cervical adenopathy.  Neurological: She is alert.  Skin: Skin is warm and dry. She is not diaphoretic.  Psychiatric: She has a normal mood and affect. Her behavior is normal. Judgment and thought content normal.    Assessment & Plan:  1. Post-nasal drip 2. Sore throat 3. Nasal congestion -Symptoms are likely secondary to environmental allergies as patient's symptoms developed after recent exposure to dust from renovations being performed in home. She is afebrile and rapid strep was  negative. Will treat symptomatically only for now and if symptoms worsen or do not improve, return for care or follow-up with PCP.  Marland Kitchen ipratropium (ATROVENT) 0.03 % nasal spray    Sig: Place 2 sprays into both nostrils 2 (two) times daily.   Resume cetirizine 10 mg at bedtime nightly until symptoms resolve.    For throat soreness, perform warm salt water gargles as needed or until symptoms resolve.  Godfrey Pick. Tiburcio Pea, MSN, FNP-C [id number]  [place], [place] [address] [phone]

## 2016-09-25 NOTE — Patient Instructions (Signed)
Symptoms are consistent with post nasal drainage. I recommend warm salt water gargle to improve sore throat.  I am prescribing Atrovent nasal spray to relieve congestion. Resume Cetrizine 10 mg at bedtime until symptoms improve.   Allergies An allergy is when your body reacts to a substance in a way that is not normal. An allergic reaction can happen after you:  Eat something.  Breathe in something.  Touch something.  You can be allergic to:  Things that are only around during certain seasons, like molds and pollens.  Foods.  Drugs.  Insects.  Animal dander.  What are the signs or symptoms?  Puffiness (swelling). This may happen on the lips, face, tongue, mouth, or throat.  Sneezing.  Coughing.  Breathing loudly (wheezing).  Stuffy nose.  Tingling in the mouth.  A rash.  Itching.  Itchy, red, puffy areas of skin (hives).  Watery eyes.  Throwing up (vomiting).  Watery poop (diarrhea).  Dizziness.  Feeling faint or fainting.  Trouble breathing or swallowing.  A tight feeling in the chest.  A fast heartbeat. How is this diagnosed? Allergies can be diagnosed with:  A medical and family history.  Skin tests.  Blood tests.  A food diary. A food diary is a record of all the foods, drinks, and symptoms you have each day.  The results of an elimination diet. This diet involves making sure not to eat certain foods and then seeing what happens when you start eating them again.  How is this treated? There is no cure for allergies, but allergic reactions can be treated with medicine. Severe reactions usually need to be treated at a hospital. How is this prevented? The best way to prevent an allergic reaction is to avoid the thing you are allergic to. Allergy shots and medicines can also help prevent reactions in some cases. This information is not intended to replace advice given to you by your health care provider. Make sure you discuss any questions  you have with your health care provider. Document Released: 04/30/2012 Document Revised: 08/31/2015 Document Reviewed: 10/15/2013 Elsevier Interactive Patient Education  [employer]2018 Elsevier Inc.

## 2016-09-28 ENCOUNTER — Telehealth: Payer: Self-pay | Admitting: Emergency Medicine

## 2017-02-24 ENCOUNTER — Telehealth: Payer: Self-pay | Admitting: *Deleted

## 2017-02-24 NOTE — Telephone Encounter (Signed)
Copied from CRM [id number]. Topic: Appointment Scheduling - New Patient >> Feb 24, 2017  1:44 PM Oneal GroutSebastian, Jennifer S wrote: New patient has been scheduled for your office. Provider: Dr Patsy Lageropland Date of Appointment: 06/29/17  Route to department's PEC pool.

## 2017-05-23 ENCOUNTER — Encounter: Payer: Self-pay | Admitting: Family Medicine

## 2017-06-25 NOTE — Progress Notes (Addendum)
[facility] [address], [address] [place], [place] [address] [phone]  Date:  06/29/2017   Name:  Rebecca Key   DOB:  10-18-1970   MRN:  [id number]  PCP:  Pearline Cables, MD    Chief Complaint: New Patient (Initial Visit) (pt saw you at [facility]) and Annual Exam   History of Present Illness:  Rebecca Key is a 47 y.o. very pleasant female patient who presents with the following:  Here today as a new patient, but I did see her at [facility] several years ago.  She is generally in good health  She would like to do a physical exam today which is fine  Patient Active Problem List   Diagnosis Date Noted  . Cystocele, grade 3 04/18/2013  . 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   She will finish up this week and is looking forward to a break  Her children are in good health Married to [place]  Since moving to a new home she has noted more issues with her hemorrhoids and mild constipatoin thinks this might be due to well water- ?higher iron content A few months ago she had a bit of blood in her stool after straining- it was a small amount and did not recur  She reports having iron deficiency in the past and taking iron which did cause constipation  We will get labs for her today Dr. Tildon Husky does her pap and mammogram She has the mirena IUD which she likes, she does not get menses  Both of her parents are decreased-  Her father died while in [place], the exact cause of his death is uncertain Her mother died after a fall and head bleed while on coumadin  She is not aware of any family history of colon cancer  She does not smoke, rare alcohol Admits that she does not exercise much   Past Medical History:  Diagnosis Date  . Active labor 02/02/2011  .  Allergy   . Anemia    with pregnancy  . Chicken pox   . Headache(784.0)    occ  . History of fainting spells of Rebecca cause   . Inguinal hernia   . No pertinent past medical history   . PP care - s/p SVD 1/16 02/03/2011  . Vaginal delivery 2010, 2013    Past Surgical History:  Procedure Laterality Date  . BLADDER SUSPENSION N/A 04/18/2013   Procedure: TRANSVAGINAL TAPE (TVT) Exact PROCEDURE;  Surgeon: Robley Fries, MD;  Location: [facility];  Service: Gynecology;  Laterality: N/A;  . CYSTOCELE REPAIR N/A 04/18/2013   Procedure: ANTERIOR REPAIR (CYSTOCELE); Anterior Colporrhaphy;  Surgeon: Robley Fries, MD;  Location: [facility];  Service: Gynecology;  Laterality: N/A;  90 min.  . CYSTOSCOPY N/A 04/18/2013   Procedure: CYSTOSCOPY;  Surgeon: Robley Fries, MD;  Location: [facility];  Service: Gynecology;  Laterality: N/A;  . HERNIA REPAIR    . MANDIBLE FRACTURE SURGERY    . RECTOCELE REPAIR  04/18/2013   Procedure: POSTERIOR REPAIR (RECTOCELE);  Surgeon: Robley Fries, MD;  Location: [facility];  Service: Gynecology;;  . SHOULDER ARTHROSCOPY    . VAGINAL PROLAPSE REPAIR  2014    Social  History   Tobacco Use  . Smoking status: Never Smoker  . Smokeless tobacco: Never Used  Substance Use Topics  . Alcohol use: Yes    Comment: occassional  . Drug use: No    Family History  Problem Relation Age of Onset  . Heart disease Mother   . Stroke Mother   . Arthritis Mother   . Hyperlipidemia Mother   . Intellectual disability Mother   . Hyperlipidemia Father   . Heart disease Maternal Grandmother   . Cancer Maternal Grandfather   . Cancer Paternal Grandmother   . Hearing loss Paternal Grandfather     Allergies  Allergen Reactions  . Sulfa Antibiotics Swelling    Medication list has been reviewed and updated.  Current Outpatient Medications on File Prior to Visit  Medication Sig Dispense Refill  . cetirizine (ZYRTEC) 10 MG tablet Take 10 mg by mouth daily.    Marland Kitchen ibuprofen  (ADVIL,MOTRIN) 200 MG tablet Take 200 mg by mouth every 6 (six) hours as needed for headache.    . levonorgestrel (MIRENA) 20 MCG/24HR IUD 1 each by Intrauterine route once.    . loratadine (CLARITIN) 10 MG tablet Take 10 mg by mouth daily as needed for allergies.     No current facility-administered medications on file prior to visit.     Review of Systems:  As per HPI- otherwise negative.   Physical Examination: Vitals:   06/29/17 1336  BP: 116/80  Pulse: 81  Resp: 16  SpO2: 98%   Vitals:   06/29/17 1336  Weight: 159 lb 12.8 oz (72.5 kg)  Height: 5\' 7"  (1.702 m)   Body mass index is 25.03 kg/m. Ideal Body Weight: Weight in (lb) to have BMI = 25: 159.3  GEN: WDWN, NAD, Non-toxic, A & O x 3, normal weight, looks well  HEENT: Atraumatic, Normocephalic. Neck supple. No masses, No LAD.  Bilateral TM wnl, oropharynx normal.  PEERL,EOMI.   Ears and Nose: No external deformity. CV: RRR, No M/G/R. No JVD. No thrill. No extra heart sounds. PULM: CTA B, no wheezes, crackles, rhonchi. No retractions. No resp. distress. No accessory muscle use. ABD: S, NT, ND. No rebound. No HSM. EXTR: No c/c/e NEURO Normal gait.  Normal DTR of all extremities  PSYCH: Normally interactive. Conversant. Not depressed or anxious appearing.  Calm demeanor.    Assessment and Plan: Physical exam  Screening for diabetes mellitus - Plan: Comprehensive metabolic panel, Hemoglobin A1c  Screening for deficiency anemia - Plan: CBC  Screening for hyperlipidemia - Plan: Lipid panel  History of iron deficiency - Plan: Ferritin  Constipation, unspecified constipation type  CPE today- labs pending as above Will check her ferritin Discussed strategies to combat constipation; suggested a stool softener If she has any recurrent/ persistent bleeding she will alert me  Signed Abbe Amsterdam, MD  Received her labs, letter to pt   Results for orders placed or performed in visit on 06/29/17  CBC  Result  Value Ref Range   WBC 7.0 4.0 - 10.5 K/uL   RBC 4.32 3.87 - 5.11 Mil/uL   Platelets 226.0 150.0 - 400.0 K/uL   Hemoglobin 13.6 12.0 - 15.0 g/dL   HCT 40.9 81.1 - 91.4 %   MCV 92.5 78.0 - 100.0 fl   MCHC 33.9 30.0 - 36.0 g/dL   RDW 78.2 95.6 - 21.3 %  Comprehensive metabolic panel  Result Value Ref Range   Sodium 141 135 - 145 mEq/L   Potassium 3.8 3.5 - 5.1  mEq/L   Chloride 104 96 - 112 mEq/L   CO2 29 19 - 32 mEq/L   Glucose, Bld 106 (H) 70 - 99 mg/dL   BUN 9 6 - 23 mg/dL   Creatinine, Ser 0.340.68 0.40 - 1.20 mg/dL   Total Bilirubin 0.4 0.2 - 1.2 mg/dL   Alkaline Phosphatase 56 39 - 117 U/L   AST 15 0 - 37 U/L   ALT 6 0 - 35 U/L   Total Protein 6.6 6.0 - 8.3 g/dL   Albumin 4.2 3.5 - 5.2 g/dL   Calcium 8.8 8.4 - 74.210.5 mg/dL   GFR 59.5699.48 >38.75>60.00 mL/min  Hemoglobin A1c  Result Value Ref Range   Hgb A1c MFr Bld 5.5 4.6 - 6.5 %  Lipid panel  Result Value Ref Range   Cholesterol 202 (H) 0 - 200 mg/dL   Triglycerides 643.3130.0 0.0 - 149.0 mg/dL   HDL 29.5151.60 >88.41>39.00 mg/dL   VLDL 66.026.0 0.0 - 63.040.0 mg/dL   LDL Cholesterol 160125 (H) 0 - 99 mg/dL   Total CHOL/HDL Ratio 4    NonHDL 150.55   Ferritin  Result Value Ref Range   Ferritin 40.2 10.0 - 291.0 ng/mL

## 2017-06-29 ENCOUNTER — Encounter: Payer: Self-pay | Admitting: Family Medicine

## 2017-06-29 ENCOUNTER — Ambulatory Visit: Payer: BC Managed Care – PPO | Admitting: Family Medicine

## 2017-06-29 VITALS — BP 116/80 | HR 81 | Resp 16 | Ht 67.0 in | Wt 159.8 lb

## 2017-06-29 DIAGNOSIS — Z0000 Encounter for general adult medical examination without abnormal findings: Secondary | ICD-10-CM | POA: Diagnosis not present

## 2017-06-29 DIAGNOSIS — Z8639 Personal history of other endocrine, nutritional and metabolic disease: Secondary | ICD-10-CM | POA: Diagnosis not present

## 2017-06-29 DIAGNOSIS — Z130 Encounter for screening for diseases of the blood and blood-forming organs and certain disorders involving the immune mechanism: Secondary | ICD-10-CM | POA: Diagnosis not present

## 2017-06-29 DIAGNOSIS — Z13220 Encounter for screening for lipoid disorders: Secondary | ICD-10-CM

## 2017-06-29 DIAGNOSIS — Z131 Encounter for screening for diabetes mellitus: Secondary | ICD-10-CM | POA: Diagnosis not present

## 2017-06-29 DIAGNOSIS — K5900 Constipation, unspecified: Secondary | ICD-10-CM

## 2017-06-29 NOTE — Patient Instructions (Addendum)
Great to see you again today- thanks for coming out to my new office! I will be in touch with your labs asap For constipation, you might try a stool softener such as colace (docusate sodium) otc as needed If you continue to have concerns or if you have any recurrent bleeding please let me know   Health Maintenance, Female Adopting a healthy lifestyle and getting preventive care can go a long way to promote health and wellness. Talk with your health care provider about what schedule of regular examinations is right for you. This is a good chance for you to check in with your provider about disease prevention and staying healthy. In between checkups, there are plenty of things you can do on your own. Experts have done a lot of research about which lifestyle changes and preventive measures are most likely to keep you healthy. Ask your health care provider for more information. Weight and diet Eat a healthy diet  Be sure to include plenty of vegetables, fruits, low-fat dairy products, and lean protein.  Do not eat a lot of foods high in solid fats, added sugars, or salt.  Get regular exercise. This is one of the most important things you can do for your health. ? Most adults should exercise for at least 150 minutes each week. The exercise should increase your heart rate and make you sweat (moderate-intensity exercise). ? Most adults should also do strengthening exercises at least twice a week. This is in addition to the moderate-intensity exercise.  Maintain a healthy weight  Body mass index (BMI) is a measurement that can be used to identify possible weight problems. It estimates body fat based on height and weight. Your health care provider can help determine your BMI and help you achieve or maintain a healthy weight.  For females 45 years of age and older: ? A BMI below 18.5 is considered underweight. ? A BMI of 18.5 to 24.9 is normal. ? A BMI of 25 to 29.9 is considered overweight. ? A BMI  of 30 and above is considered obese.  Watch levels of cholesterol and blood lipids  You should start having your blood tested for lipids and cholesterol at 45 years of age, then have this test every 5 years.  You may need to have your cholesterol levels checked more often if: ? Your lipid or cholesterol levels are high. ? You are older than 45 years of age. ? You are at high risk for heart disease.  Cancer screening Lung Cancer  Lung cancer screening is recommended for adults 7-22 years old who are at high risk for lung cancer because of a history of smoking.  A yearly low-dose CT scan of the lungs is recommended for people who: ? Currently smoke. ? Have quit within the past 15 years. ? Have at least a 30-pack-year history of smoking. A pack year is smoking an average of one pack of cigarettes a day for 1 year.  Yearly screening should continue until it has been 15 years since you quit.  Yearly screening should stop if you develop a health problem that would prevent you from having lung cancer treatment.  Breast Cancer  Practice breast self-awareness. This means understanding how your breasts normally appear and feel.  It also means doing regular breast self-exams. Let your health care provider know about any changes, no matter how small.  If you are in your 20s or 30s, you should have a clinical breast exam (CBE) by a health care  provider every 1-3 years as part of a regular health exam.  If you are 44 or older, have a CBE every year. Also consider having a breast X-ray (mammogram) every year.  If you have a family history of breast cancer, talk to your health care provider about genetic screening.  If you are at high risk for breast cancer, talk to your health care provider about having an MRI and a mammogram every year.  Breast cancer gene (BRCA) assessment is recommended for women who have family members with BRCA-related cancers. BRCA-related cancers  include: ? Breast. ? Ovarian. ? Tubal. ? Peritoneal cancers.  Results of the assessment will determine the need for genetic counseling and BRCA1 and BRCA2 testing.  Cervical Cancer Your health care provider may recommend that you be screened regularly for cancer of the pelvic organs (ovaries, uterus, and vagina). This screening involves a pelvic examination, including checking for microscopic changes to the surface of your cervix (Pap test). You may be encouraged to have this screening done every 3 years, beginning at age 3.  For women ages 27-65, health care providers may recommend pelvic exams and Pap testing every 3 years, or they may recommend the Pap and pelvic exam, combined with testing for human papilloma virus (HPV), every 5 years. Some types of HPV increase your risk of cervical cancer. Testing for HPV may also be done on women of any age with unclear Pap test results.  Other health care providers may not recommend any screening for nonpregnant women who are considered low risk for pelvic cancer and who do not have symptoms. Ask your health care provider if a screening pelvic exam is right for you.  If you have had past treatment for cervical cancer or a condition that could lead to cancer, you need Pap tests and screening for cancer for at least 20 years after your treatment. If Pap tests have been discontinued, your risk factors (such as having a new sexual partner) need to be reassessed to determine if screening should resume. Some women have medical problems that increase the chance of getting cervical cancer. In these cases, your health care provider may recommend more frequent screening and Pap tests.  Colorectal Cancer  This type of cancer can be detected and often prevented.  Routine colorectal cancer screening usually begins at 45 years of age and continues through 45 years of age.  Your health care provider may recommend screening at an earlier age if you have risk factors  for colon cancer.  Your health care provider may also recommend using home test kits to check for hidden blood in the stool.  A small camera at the end of a tube can be used to examine your colon directly (sigmoidoscopy or colonoscopy). This is done to check for the earliest forms of colorectal cancer.  Routine screening usually begins at age 27.  Direct examination of the colon should be repeated every 5-10 years through 45 years of age. However, you may need to be screened more often if early forms of precancerous polyps or small growths are found.  Skin Cancer  Check your skin from head to toe regularly.  Tell your health care provider about any new moles or changes in moles, especially if there is a change in a mole's shape or color.  Also tell your health care provider if you have a mole that is larger than the size of a pencil eraser.  Always use sunscreen. Apply sunscreen liberally and repeatedly throughout the day.  Protect yourself by wearing long sleeves, pants, a wide-brimmed hat, and sunglasses whenever you are outside.  Heart disease, diabetes, and high blood pressure  High blood pressure causes heart disease and increases the risk of stroke. High blood pressure is more likely to develop in: ? People who have blood pressure in the high end of the normal range (130-139/85-89 mm Hg). ? People who are overweight or obese. ? People who are African American.  If you are 57-45 years of age, have your blood pressure checked every 3-5 years. If you are 19 years of age or older, have your blood pressure checked every year. You should have your blood pressure measured twice-once when you are at a hospital or clinic, and once when you are not at a hospital or clinic. Record the average of the two measurements. To check your blood pressure when you are not at a hospital or clinic, you can use: ? An automated blood pressure machine at a pharmacy. ? A home blood pressure monitor.  If  you are between 19 years and 38 years old, ask your health care provider if you should take aspirin to prevent strokes.  Have regular diabetes screenings. This involves taking a blood sample to check your fasting blood sugar level. ? If you are at a normal weight and have a low risk for diabetes, have this test once every three years after 45 years of age. ? If you are overweight and have a high risk for diabetes, consider being tested at a younger age or more often. Preventing infection Hepatitis B  If you have a higher risk for hepatitis B, you should be screened for this virus. You are considered at high risk for hepatitis B if: ? You were born in a country where hepatitis B is common. Ask your health care provider which countries are considered high risk. ? Your parents were born in a high-risk country, and you have not been immunized against hepatitis B (hepatitis B vaccine). ? You have HIV or AIDS. ? You use needles to inject street drugs. ? You live with someone who has hepatitis B. ? You have had sex with someone who has hepatitis B. ? You get hemodialysis treatment. ? You take certain medicines for conditions, including cancer, organ transplantation, and autoimmune conditions.  Hepatitis C  Blood testing is recommended for: ? Everyone born from [birth year] through 1963. ? Anyone with known risk factors for hepatitis C.  Sexually transmitted infections (STIs)  You should be screened for sexually transmitted infections (STIs) including gonorrhea and chlamydia if: ? You are sexually active and are younger than 45 years of age. ? You are older than 45 years of age and your health care provider tells you that you are at risk for this type of infection. ? Your sexual activity has changed since you were last screened and you are at an increased risk for chlamydia or gonorrhea. Ask your health care provider if you are at risk.  If you do not have HIV, but are at risk, it may be recommended  that you take a prescription medicine daily to prevent HIV infection. This is called pre-exposure prophylaxis (PrEP). You are considered at risk if: ? You are sexually active and do not regularly use condoms or know the HIV status of your partner(s). ? You take drugs by injection. ? You are sexually active with a partner who has HIV.  Talk with your health care provider about whether you are at high risk of  being infected with HIV. If you choose to begin PrEP, you should first be tested for HIV. You should then be tested every 3 months for as long as you are taking PrEP. Pregnancy  If you are premenopausal and you may become pregnant, ask your health care provider about preconception counseling.  If you may become pregnant, take 400 to 800 micrograms (mcg) of folic acid every day.  If you want to prevent pregnancy, talk to your health care provider about birth control (contraception). Osteoporosis and menopause  Osteoporosis is a disease in which the bones lose minerals and strength with aging. This can result in serious bone fractures. Your risk for osteoporosis can be identified using a bone density scan.  If you are 63 years of age or older, or if you are at risk for osteoporosis and fractures, ask your health care provider if you should be screened.  Ask your health care provider whether you should take a calcium or vitamin D supplement to lower your risk for osteoporosis.  Menopause may have certain physical symptoms and risks.  Hormone replacement therapy may reduce some of these symptoms and risks. Talk to your health care provider about whether hormone replacement therapy is right for you. Follow these instructions at home:  Schedule regular health, dental, and eye exams.  Stay current with your immunizations.  Do not use any tobacco products including cigarettes, chewing tobacco, or electronic cigarettes.  If you are pregnant, do not drink alcohol.  If you are  breastfeeding, limit how much and how often you drink alcohol.  Limit alcohol intake to no more than 1 drink per day for nonpregnant women. One drink equals 12 ounces of beer, 5 ounces of wine, or 1 ounces of hard liquor.  Do not use street drugs.  Do not share needles.  Ask your health care provider for help if you need support or information about quitting drugs.  Tell your health care provider if you often feel depressed.  Tell your health care provider if you have ever been abused or do not feel safe at home. This information is not intended to replace advice given to you by your health care provider. Make sure you discuss any questions you have with your health care provider. Document Released: 07/19/2010 Document Revised: 06/11/2015 Document Reviewed: 10/07/2014 Elsevier Interactive Patient Education  [employer].

## 2017-06-30 LAB — CBC
HCT: 40 % (ref 36.0–46.0)
Hemoglobin: 13.6 g/dL (ref 12.0–15.0)
MCHC: 33.9 g/dL (ref 30.0–36.0)
MCV: 92.5 fl (ref 78.0–100.0)
Platelets: 226 10*3/uL (ref 150.0–400.0)
RBC: 4.32 Mil/uL (ref 3.87–5.11)
RDW: 13.1 % (ref 11.5–15.5)
WBC: 7 10*3/uL (ref 4.0–10.5)

## 2017-06-30 LAB — FERRITIN: Ferritin: 40.2 ng/mL (ref 10.0–291.0)

## 2017-06-30 LAB — COMPREHENSIVE METABOLIC PANEL
ALT: 6 U/L (ref 0–35)
AST: 15 U/L (ref 0–37)
Albumin: 4.2 g/dL (ref 3.5–5.2)
Alkaline Phosphatase: 56 U/L (ref 39–117)
BUN: 9 mg/dL (ref 6–23)
CO2: 29 mEq/L (ref 19–32)
CREATININE: 0.68 mg/dL (ref 0.40–1.20)
Calcium: 8.8 mg/dL (ref 8.4–10.5)
Chloride: 104 mEq/L (ref 96–112)
GFR: 99.48 mL/min (ref >60.00)
Glucose, Bld: 106 mg/dL — ABNORMAL HIGH (ref 70–99)
Potassium: 3.8 mEq/L (ref 3.5–5.1)
SODIUM: 141 meq/L (ref 135–145)
Total Bilirubin: 0.4 mg/dL (ref 0.2–1.2)
Total Protein: 6.6 g/dL (ref 6.0–8.3)

## 2017-06-30 LAB — LIPID PANEL
CHOL/HDL RATIO: 4
CHOLESTEROL: 202 mg/dL — AB (ref 0–200)
HDL: 51.6 mg/dL (ref >39.00)
LDL Cholesterol: 125 mg/dL — ABNORMAL HIGH (ref 0–99)
NONHDL: 150.55
Triglycerides: 130 mg/dL (ref 0.0–149.0)
VLDL: 26 mg/dL (ref 0.0–40.0)

## 2017-06-30 LAB — HEMOGLOBIN A1C: Hgb A1c MFr Bld: 5.5 % (ref 4.6–6.5)

## 2017-07-03 ENCOUNTER — Telehealth: Payer: Self-pay | Admitting: *Deleted

## 2017-07-03 NOTE — Telephone Encounter (Signed)
[facility]; forwarded to provider/SLS 06/17

## 2017-08-25 ENCOUNTER — Ambulatory Visit: Payer: BC Managed Care – PPO | Admitting: Nurse Practitioner

## 2017-08-25 ENCOUNTER — Encounter: Payer: Self-pay | Admitting: Nurse Practitioner

## 2017-08-25 VITALS — BP 112/82 | HR 81 | Temp 98.3°F | Ht 67.0 in | Wt 160.0 lb

## 2017-08-25 DIAGNOSIS — J209 Acute bronchitis, unspecified: Secondary | ICD-10-CM | POA: Diagnosis not present

## 2017-08-25 MED ORDER — ALBUTEROL SULFATE HFA 108 (90 BASE) MCG/ACT IN AERS: 1.0000 | INHALATION_SPRAY | Freq: Four times a day (QID) | RESPIRATORY_TRACT | 0 refills | Status: DC | PRN

## 2017-08-25 MED ORDER — HYDROCODONE-HOMATROPINE 5-1.5 MG/5ML PO SYRP
5.0000 mL | ORAL_SOLUTION | Freq: Every evening | ORAL | 0 refills | Status: DC | PRN
Start: 2017-08-25 — End: 2018-02-09

## 2017-08-25 NOTE — Patient Instructions (Signed)
Declined depomedrol IM today Call office of send mychart message if no improvement in 3days. Order CXR if no improvement with albuterol and cough medication.  Acute Bronchitis, Adult Acute bronchitis is sudden (acute) swelling of the air tubes (bronchi) in the lungs. Acute bronchitis causes these tubes to fill with mucus, which can make it hard to breathe. It can also cause coughing or wheezing. In adults, acute bronchitis usually goes away within 2 weeks. A cough caused by bronchitis may last up to 3 weeks. Smoking, allergies, and asthma can make the condition worse. Repeated episodes of bronchitis may cause further lung problems, such as chronic obstructive pulmonary disease (COPD). What are the causes? This condition can be caused by germs and by substances that irritate the lungs, including:  Cold and flu viruses. This condition is most often caused by the same virus that causes a cold.  Bacteria.  Exposure to tobacco smoke, dust, fumes, and air pollution.  What increases the risk? This condition is more likely to develop in people who:  Have close contact with someone with acute bronchitis.  Are exposed to lung irritants, such as tobacco smoke, dust, fumes, and vapors.  Have a weak immune system.  Have a respiratory condition such as asthma.  What are the signs or symptoms? Symptoms of this condition include:  A cough.  Coughing up clear, yellow, or green mucus.  Wheezing.  Chest congestion.  Shortness of breath.  A fever.  Body aches.  Chills.  A sore throat.  How is this diagnosed? This condition is usually diagnosed with a physical exam. During the exam, your health care provider may order tests, such as chest X-rays, to rule out other conditions. He or she may also:  Test a sample of your mucus for bacterial infection.  Check the level of oxygen in your blood. This is done to check for pneumonia.  Do a chest X-ray or lung function testing to rule out  pneumonia and other conditions.  Perform blood tests.  Your health care provider will also ask about your symptoms and medical history. How is this treated? Most cases of acute bronchitis clear up over time without treatment. Your health care provider may recommend:  Drinking more fluids. Drinking more makes your mucus thinner, which may make it easier to breathe.  Taking a medicine for a fever or cough.  Taking an antibiotic medicine.  Using an inhaler to help improve shortness of breath and to control a cough.  Using a cool mist vaporizer or humidifier to make it easier to breathe.  Follow these instructions at home: Medicines  Take over-the-counter and prescription medicines only as told by your health care provider.  If you were prescribed an antibiotic, take it as told by your health care provider. Do not stop taking the antibiotic even if you start to feel better. General instructions  Get plenty of rest.  Drink enough fluids to keep your urine clear or pale yellow.  Avoid smoking and secondhand smoke. Exposure to cigarette smoke or irritating chemicals will make bronchitis worse. If you smoke and you need help quitting, ask your health care provider. Quitting smoking will help your lungs heal faster.  Use an inhaler, cool mist vaporizer, or humidifier as told by your health care provider.  Keep all follow-up visits as told by your health care provider. This is important. How is this prevented? To lower your risk of getting this condition again:  Wash your hands often with soap and water. If soap  and water are not available, use hand sanitizer.  Avoid contact with people who have cold symptoms.  Try not to touch your hands to your mouth, nose, or eyes.  Make sure to get the flu shot every year.  Contact a health care provider if:  Your symptoms do not improve in 2 weeks of treatment. Get help right away if:  You cough up blood.  You have chest pain.  You  have severe shortness of breath.  You become dehydrated.  You faint or keep feeling like you are going to faint.  You keep vomiting.  You have a severe headache.  Your fever or chills gets worse. This information is not intended to replace advice given to you by your health care provider. Make sure you discuss any questions you have with your health care provider. Document Released: 02/11/2004 Document Revised: 07/29/2015 Document Reviewed: 06/24/2015 Elsevier Interactive Patient Education  [employer].

## 2017-08-25 NOTE — Progress Notes (Signed)
Subjective:  Patient ID: Rebecca Key, female    DOB: 02-Jan-1971  Age: 47 y.o. MRN: [id number]  CC: Cough (pt c/o of slight productive cough, congestion, SOB at night x 3 weeks, improving but lingering )  Cough  This is a new problem. The current episode started 1 to 4 weeks ago. The problem has been gradually improving. The cough is non-productive. Associated symptoms include nasal congestion, postnasal drip and shortness of breath. Pertinent negatives include no chest pain, chills, fever, headaches, rhinorrhea, sore throat or wheezing. The symptoms are aggravated by lying down. She has tried OTC cough suppressant for the symptoms. The treatment provided mild relief. Her past medical history is significant for environmental allergies. There is no history of bronchitis.  denies any GERD symptoms Sons were sick with similar symptoms  Reviewed past Medical, Social and Family history today.  Outpatient Medications Prior to Visit  Medication Sig Dispense Refill  . ibuprofen (ADVIL,MOTRIN) 200 MG tablet Take 200 mg by mouth every 6 (six) hours as needed for headache.    . levonorgestrel (MIRENA) 20 MCG/24HR IUD 1 each by Intrauterine route once.    . cetirizine (ZYRTEC) 10 MG tablet Take 10 mg by mouth daily.    Marland Kitchen loratadine (CLARITIN) 10 MG tablet Take 10 mg by mouth daily as needed for allergies.     No facility-administered medications prior to visit.     ROS See HPI  Objective:  BP 112/82   Pulse 81   Temp 98.3 F (36.8 C) (Oral)   Ht 5\' 7"  (1.702 m)   Wt 160 lb (72.6 kg)   SpO2 97%   BMI 25.06 kg/m   BP Readings from Last 3 Encounters:  08/25/17 112/82  06/29/17 116/80  09/25/16 114/78    Wt Readings from Last 3 Encounters:  08/25/17 160 lb (72.6 kg)  06/29/17 159 lb 12.8 oz (72.5 kg)  09/25/16 161 lb 6.4 oz (73.2 kg)    Physical Exam  Constitutional: She is oriented to person, place, and time. No distress.  HENT:  Right Ear: External ear normal.  Left Ear:  External ear normal.  Nose: Nose normal.  Mouth/Throat: Oropharynx is clear and moist. No oropharyngeal exudate.  Cardiovascular: Normal rate and regular rhythm.  Pulmonary/Chest: Effort normal and breath sounds normal. She has no wheezes. She has no rales.  Neurological: She is alert and oriented to person, place, and time.  Vitals reviewed.   Lab Results  Component Value Date   WBC 7.0 06/29/2017   HGB 13.6 06/29/2017   HCT 40.0 06/29/2017   PLT 226.0 06/29/2017   GLUCOSE 106 (H) 06/29/2017   CHOL 202 (H) 06/29/2017   TRIG 130.0 06/29/2017   HDL 51.60 06/29/2017   LDLDIRECT 102 (H) 12/09/2011   LDLCALC 125 (H) 06/29/2017   ALT 6 06/29/2017   AST 15 06/29/2017   NA 141 06/29/2017   K 3.8 06/29/2017   CL 104 06/29/2017   CREATININE 0.68 06/29/2017   BUN 9 06/29/2017   CO2 29 06/29/2017   TSH 1.728 12/09/2011   HGBA1C 5.5 06/29/2017    Mm Screening Breast Tomo Bilateral  Result Date: 07/03/2013 CLINICAL DATA:  Screening. EXAM: DIGITAL SCREENING BILATERAL MAMMOGRAM WITH 3D TOMO WITH CAD COMPARISON:  None.  Baseline study. ACR Breast Density Category b: There are scattered areas of fibroglandular density. FINDINGS: There are no findings suspicious for malignancy. Images were processed with CAD. IMPRESSION: No mammographic evidence of malignancy. A result letter of this screening mammogram will  be mailed directly to the patient. RECOMMENDATION: Screening mammogram in one year. (Code:SM-B-01Y) BI-RADS CATEGORY  1: Negative. Electronically Signed   By: Raymondo BandAndrew  Crane   On: 07/03/2013 14:50    Assessment & Plan:   Rebecca Key was seen today for cough.  Diagnoses and all orders for this visit:  Acute bronchitis, unspecified organism -     albuterol (PROVENTIL HFA;VENTOLIN HFA) 108 (90 Base) MCG/ACT inhaler; Inhale 1-2 puffs into the lungs every 6 (six) hours as needed. -     HYDROcodone-homatropine (HYCODAN) 5-1.5 MG/5ML syrup; Take 5 mLs by mouth at bedtime as needed for  cough.   I am having Rebecca Key start on albuterol and HYDROcodone-homatropine. I am also having her maintain her levonorgestrel, cetirizine, loratadine, and ibuprofen.  Meds ordered this encounter  Medications  . albuterol (PROVENTIL HFA;VENTOLIN HFA) 108 (90 Base) MCG/ACT inhaler    Sig: Inhale 1-2 puffs into the lungs every 6 (six) hours as needed.    Dispense:  1 Inhaler    Refill:  0    Order Specific Question:   Supervising Provider    Answer:   Dianne DunARON, TALIA M [[id number]]  . HYDROcodone-homatropine (HYCODAN) 5-1.5 MG/5ML syrup    Sig: Take 5 mLs by mouth at bedtime as needed for cough.    Dispense:  60 mL    Refill:  0    Order Specific Question:   Supervising Provider    Answer:   Dianne DunARON, TALIA M [[id number]]    Follow-up: Return if symptoms worsen or fail to improve.  Rebecca Pennaharlotte Imonie Tuch, NP

## 2017-09-02 ENCOUNTER — Ambulatory Visit: Payer: BC Managed Care – PPO | Admitting: Family Medicine

## 2017-09-02 ENCOUNTER — Encounter: Payer: Self-pay | Admitting: Family Medicine

## 2017-09-02 DIAGNOSIS — J9801 Acute bronchospasm: Secondary | ICD-10-CM | POA: Diagnosis not present

## 2017-09-02 MED ORDER — PREDNISONE 10 MG PO TABS: ORAL_TABLET | ORAL | 0 refills | Status: DC

## 2017-09-02 NOTE — Patient Instructions (Signed)
Complete course of prednisone.  USe albuterol as needed. If severe shortness of breath .. Got to ER.

## 2017-09-02 NOTE — Progress Notes (Signed)
   Subjective:    Patient ID: Rebecca Key, female    DOB: 12/06/1970, 47 y.o.   MRN: [id number]  HPI   47 year old female pt seen on 08/25/2017 for acute bronchitis ongoing x 3 weeks... Treated with  Albuterol and Hyodan cough suppressant. Continued allergy medications.  She was hesitant to take the albuterol.. Only tried in last 2 days. Hydrocodone helped her sleep at night.  She reports today she continues to have cough , coughing fits, dry cough... She does feel some better overall but in last few days she has felt very tired.   No fever.. But felt clammy yesterday.  No SOB.  No CP. No ear pain, no sinus pain, no ST, yesterday headache.   Blood pressure 122/76, pulse 68, temperature 98.2 F (36.8 C), temperature source Oral, resp. rate 16, height 5\' 7"  (1.702 m), weight 161 lb (73 kg), SpO2 98 %. Social History /Family History/Past Medical History reviewed in detail and updated in EMR if needed.  Review of Systems  Constitutional: Negative for fatigue and fever.  HENT: Negative for congestion.   Eyes: Negative for pain.  Respiratory: Negative for cough and shortness of breath.   Cardiovascular: Negative for chest pain, palpitations and leg swelling.  Gastrointestinal: Negative for abdominal pain.  Genitourinary: Negative for dysuria and vaginal bleeding.  Musculoskeletal: Negative for back pain.  Neurological: Negative for syncope, light-headedness and headaches.  Psychiatric/Behavioral: Negative for dysphoric mood.       Objective:   Physical Exam  Constitutional: Vital signs are normal. She appears well-developed and well-nourished. She is cooperative.  Non-toxic appearance. She does not appear ill. No distress.  HENT:  Head: Normocephalic.  Right Ear: Hearing, tympanic membrane, external ear and ear canal normal. Tympanic membrane is not erythematous, not retracted and not bulging.  Left Ear: Hearing, tympanic membrane, external ear and ear canal normal. Tympanic  membrane is not erythematous, not retracted and not bulging.  Nose: Mucosal edema and rhinorrhea present. Right sinus exhibits no maxillary sinus tenderness and no frontal sinus tenderness. Left sinus exhibits no maxillary sinus tenderness and no frontal sinus tenderness.  Mouth/Throat: Uvula is midline, oropharynx is clear and moist and mucous membranes are normal.  Eyes: Pupils are equal, round, and reactive to light. Conjunctivae, EOM and lids are normal. Lids are everted and swept, no foreign bodies found.  Neck: Trachea normal and normal range of motion. Neck supple. Carotid bruit is not present. No thyroid mass and no thyromegaly present.  Cardiovascular: Normal rate, regular rhythm, S1 normal, S2 normal, normal heart sounds, intact distal pulses and normal pulses. Exam reveals no gallop and no friction rub.  No murmur heard. Pulmonary/Chest: Effort normal and breath sounds normal. No tachypnea. No respiratory distress. She has no decreased breath sounds. She has no wheezes. She has no rhonchi. She has no rales.   Dry cough with deep breaths  Neurological: She is alert.  Skin: Skin is warm, dry and intact. No rash noted.  Psychiatric: Her speech is normal and behavior is normal. Judgment normal. Her mood appears not anxious. Cognition and memory are normal. She does not exhibit a depressed mood.          Assessment & Plan:

## 2017-09-02 NOTE — Assessment & Plan Note (Signed)
No clear current infeciton.Marland Kitchen. Resolved. No with persistent dry cough. Treat with oral steroids and albuterol as needed.

## 2017-09-14 ENCOUNTER — Ambulatory Visit: Payer: Self-pay | Admitting: Family Medicine

## 2017-09-14 NOTE — Telephone Encounter (Signed)
Pt c/o gagging on her way to taking her kids to school. She stated that she is having some post nasal drip and a slight sore throat. Pt encouraged to drink warm fluids and to use her Neti pot and use Flonase to help with symptoms. Pt does deny nausea. Pt stated it mainly happens after she coughs. Home care advice given. Pt encouraged to call back if the gagging continues after doing what has been suggested.  Reason for Disposition . Nursing judgment or information in reference  Answer Assessment - Initial Assessment Questions 1. REASON FOR CALL: "What is your main concern right now?"     Gagging without nausea 2. ONSET: "When did the gagging start?"     This am  3. SEVERITY: "How bad is the gagging?"     Pt stated that she would have pulled off the road to gag but she was driving her kids to work. 4. FEVER: "Do you have a fever?"     no 5. OTHER SYMPTOMS: "Do you have any other new symptoms?"     Post nasal drip and slight sore throat 6. INTERVENTIONS AND RESPONSE: "What have you done so far to try to make this better? What medications have you used?"     Just finished steroids and abx for Upper respiratory infections.  Protocols used: NO GUIDELINE AVAILABLE-A-AH

## 2018-02-09 ENCOUNTER — Encounter: Payer: Self-pay | Admitting: Internal Medicine

## 2018-02-09 ENCOUNTER — Ambulatory Visit: Payer: BC Managed Care – PPO | Admitting: Internal Medicine

## 2018-02-09 VITALS — BP 126/78 | HR 74 | Temp 97.8°F | Resp 16 | Ht 67.0 in | Wt 170.0 lb

## 2018-02-09 DIAGNOSIS — L03031 Cellulitis of right toe: Secondary | ICD-10-CM | POA: Diagnosis not present

## 2018-02-09 DIAGNOSIS — S93421A Sprain of deltoid ligament of right ankle, initial encounter: Secondary | ICD-10-CM

## 2018-02-09 MED ORDER — CEPHALEXIN 500 MG PO CAPS: 500.0000 mg | ORAL_CAPSULE | Freq: Four times a day (QID) | ORAL | 0 refills | Status: DC

## 2018-02-09 NOTE — Progress Notes (Signed)
Subjective:    Patient ID: Rebecca Key, female    DOB: 04/08/1970, 48 y.o.   MRN: [id number]  DOS:  02/09/2018 Type of visit - description: Acute Last week, was cutting her nails and she pulled a piece of the right great toenail and it came out with a small amount of cuticle and skin. Since then, the area is tender and has gotten red and swollen.  Has noted some pus coming out.  Also, she twisted her right ankle 4 days ago, is already getting better.  Review of Systems No fever chills   Past Medical History:  Diagnosis Date  . Active labor 02/02/2011  . Allergy   . Anemia    with pregnancy  . Chicken pox   . Headache(784.0)    occ  . History of fainting spells of unknown cause   . Inguinal hernia   . No pertinent past medical history   . PP care - s/p SVD 1/16 02/03/2011  . Vaginal delivery 2010, 2013    Past Surgical History:  Procedure Laterality Date  . BLADDER SUSPENSION N/A 04/18/2013   Procedure: TRANSVAGINAL TAPE (TVT) Exact PROCEDURE;  Surgeon: Robley FriesVaishali R Mody, MD;  Location: [facility];  Service: Gynecology;  Laterality: N/A;  . CYSTOCELE REPAIR N/A 04/18/2013   Procedure: ANTERIOR REPAIR (CYSTOCELE); Anterior Colporrhaphy;  Surgeon: Robley FriesVaishali R Mody, MD;  Location: [facility];  Service: Gynecology;  Laterality: N/A;  90 min.  . CYSTOSCOPY N/A 04/18/2013   Procedure: CYSTOSCOPY;  Surgeon: Robley FriesVaishali R Mody, MD;  Location: [facility];  Service: Gynecology;  Laterality: N/A;  . HERNIA REPAIR    . MANDIBLE FRACTURE SURGERY    . RECTOCELE REPAIR  04/18/2013   Procedure: POSTERIOR REPAIR (RECTOCELE);  Surgeon: Robley FriesVaishali R Mody, MD;  Location: [facility];  Service: Gynecology;;  . SHOULDER ARTHROSCOPY    . VAGINAL PROLAPSE REPAIR  2014    Social History   Socioeconomic History  . Marital status: Married    Spouse name: Not on file  . Number of children: Not on file  . Years of education: Not on file  . Highest education level: Not on file  Occupational History  . Not on file    Social Needs  . Financial resource strain: Not on file  . Food insecurity:    Worry: Not on file    Inability: Not on file  . Transportation needs:    Medical: Not on file    Non-medical: Not on file  Tobacco Use  . Smoking status: Never Smoker  . Smokeless tobacco: Never Used  Substance and Sexual Activity  . Alcohol use: Yes    Comment: occassional  . Drug use: No  . Sexual activity: Yes    Birth control/protection: I.U.D.  Lifestyle  . Physical activity:    Days per week: Not on file    Minutes per session: Not on file  . Stress: Not on file  Relationships  . Social connections:    Talks on phone: Not on file    Gets together: Not on file    Attends religious service: Not on file    Active member of club or organization: Not on file    Attends meetings of clubs or organizations: Not on file    Relationship status: Not on file  . Intimate partner violence:    Fear of current or ex partner: Not on file    Emotionally abused: Not on file    Physically abused: Not on file  Forced sexual activity: Not on file  Other Topics Concern  . Not on file  Social History Narrative  . Not on file      Allergies as of 02/09/2018      Reactions   Sulfa Antibiotics Swelling      Medication List       Accurate as of February 09, 2018  1:51 PM. Always use your most recent med list.        albuterol 108 (90 Base) MCG/ACT inhaler Commonly known as:  PROVENTIL HFA;VENTOLIN HFA Inhale 1-2 puffs into the lungs every 6 (six) hours as needed.   cetirizine 10 MG tablet Commonly known as:  ZYRTEC Take 10 mg by mouth daily.   HYDROcodone-homatropine 5-1.5 MG/5ML syrup Commonly known as:  HYCODAN Take 5 mLs by mouth at bedtime as needed for cough.   ibuprofen 200 MG tablet Commonly known as:  ADVIL,MOTRIN Take 200 mg by mouth every 6 (six) hours as needed for headache.   levonorgestrel 20 MCG/24HR IUD Commonly known as:  MIRENA 1 each by Intrauterine route once.    loratadine 10 MG tablet Commonly known as:  CLARITIN Take 10 mg by mouth daily as needed for allergies.   predniSONE 10 MG tablet Commonly known as:  DELTASONE 3 tabs by mouth daily x 3 days, then 2 tabs by mouth daily x 2 days then 1 tab by mouth daily x 2 days           Objective:   Physical Exam Musculoskeletal:       Feet:    BP 126/78 (BP Location: Left Arm, Patient Position: Sitting, Cuff Size: Small)   Pulse 74   Temp 97.8 F (36.6 C) (Oral)   Resp 16   Ht 5\' 7"  (1.702 m)   Wt 170 lb (77.1 kg)   SpO2 99%   BMI 26.63 kg/m  General:   Well developed, NAD, BMI noted. HEENT:  Normocephalic . Face symmetric, atraumatic MSK: Left ankle and foot normal Right ankle: No deformities, no warmness or redness.  Slightly TTP.  See graphic.  Range of motion normal Right great toe, see graphic Skin: Not pale. Not jaundice Neurologic:  alert & oriented X3.  Speech normal, gait appropriate for age and unassisted Psych--  Cognition and judgment appear intact.  Cooperative with normal attention span and concentration.  Behavior appropriate. No anxious or depressed appearing.      Assessment    48 year old female, healthy, presents with: Cellulitis, right great toe: On exam, there is no abscess or anything that I could drain or culture.  Will treat with Keflex, Epson salts, call if not better, might need to see the podiatrist. Sprain, right ankle: Happened few days ago, already getting better, recommend Tylenol and ice pack as needed.  Call if not better.

## 2018-02-09 NOTE — Patient Instructions (Signed)
Take the antibiotics as prescribed  Soak your toe in warm water with Epsom salts once daily for few days  Call if not gradually better

## 2018-02-09 NOTE — Progress Notes (Signed)
Pre visit review using our clinic review tool, if applicable. No additional management support is needed unless otherwise documented below in the visit note. 

## 2018-11-06 ENCOUNTER — Other Ambulatory Visit: Payer: Self-pay

## 2018-11-06 DIAGNOSIS — Z20822 Contact with and (suspected) exposure to covid-19: Secondary | ICD-10-CM

## 2018-11-07 LAB — NOVEL CORONAVIRUS, NAA: SARS-CoV-2, NAA: NOT DETECTED

## 2019-05-08 ENCOUNTER — Ambulatory Visit: Payer: BC Managed Care – PPO | Admitting: Family Medicine

## 2019-05-08 ENCOUNTER — Encounter: Payer: Self-pay | Admitting: Family Medicine

## 2019-05-08 ENCOUNTER — Other Ambulatory Visit: Payer: Self-pay

## 2019-05-08 VITALS — BP 116/79 | HR 69 | Temp 98.4°F | Resp 16 | Ht 67.0 in | Wt 173.0 lb

## 2019-05-08 DIAGNOSIS — Z130 Encounter for screening for diseases of the blood and blood-forming organs and certain disorders involving the immune mechanism: Secondary | ICD-10-CM

## 2019-05-08 DIAGNOSIS — Z1329 Encounter for screening for other suspected endocrine disorder: Secondary | ICD-10-CM | POA: Diagnosis not present

## 2019-05-08 DIAGNOSIS — W57XXXA Bitten or stung by nonvenomous insect and other nonvenomous arthropods, initial encounter: Secondary | ICD-10-CM

## 2019-05-08 DIAGNOSIS — S30860A Insect bite (nonvenomous) of lower back and pelvis, initial encounter: Secondary | ICD-10-CM

## 2019-05-08 DIAGNOSIS — Z131 Encounter for screening for diabetes mellitus: Secondary | ICD-10-CM | POA: Diagnosis not present

## 2019-05-08 DIAGNOSIS — Z13220 Encounter for screening for lipoid disorders: Secondary | ICD-10-CM

## 2019-05-08 MED ORDER — DOXYCYCLINE HYCLATE 100 MG PO CAPS: 200.0000 mg | ORAL_CAPSULE | Freq: Once | ORAL | 0 refills | Status: AC

## 2019-05-08 NOTE — Progress Notes (Addendum)
[facility] [address], [address] [place], [place] [address] [phone]  Date:  05/08/2019   Name:  Rebecca Key   DOB:  Nov 07, 1970   MRN:  [id number]  PCP:  Pearline Cables, MD    Chief Complaint: Tick Removal   History of Present Illness:  Rebecca Key is a 49 y.o. 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 Tildon Husky  Mammogram; per her GYN Pap is up-to-date COVID-19 vaccine is done already- put into computer  Can do routine labs today if she likes  She had a tick in her groin 2 days ago, and then noted another one yesterday on her side  The one on her left buttock is a bit itchy and red, more inflamed than tick bites she has had in the past  She otherwise feels well, no rash or fever noted   Patient Active Problem List   Diagnosis Date Noted  . Cystocele, grade 3 04/18/2013    Past Medical History:  Diagnosis Date  . Active labor 02/02/2011  . Allergy   . Anemia    with pregnancy  . Chicken pox   . Headache(784.0)    occ  . History of fainting spells of unknown cause   . Inguinal hernia   . No pertinent past medical history   . PP care - s/p SVD 1/16 02/03/2011  . Vaginal delivery 2010, 2013    Past Surgical History:  Procedure Laterality Date  . BLADDER SUSPENSION N/A 04/18/2013   Procedure: TRANSVAGINAL TAPE (TVT) Exact PROCEDURE;  Surgeon: Robley Fries, MD;  Location: [facility];  Service: Gynecology;  Laterality: N/A;  . CYSTOCELE REPAIR N/A 04/18/2013   Procedure: ANTERIOR REPAIR (CYSTOCELE); Anterior Colporrhaphy;  Surgeon: Robley Fries, MD;  Location: [facility];  Service:  Gynecology;  Laterality: N/A;  90 min.  . CYSTOSCOPY N/A 04/18/2013   Procedure: CYSTOSCOPY;  Surgeon: Robley Fries, MD;  Location: [facility];  Service: Gynecology;  Laterality: N/A;  . HERNIA REPAIR    . MANDIBLE FRACTURE SURGERY    . RECTOCELE REPAIR  04/18/2013   Procedure: POSTERIOR REPAIR (RECTOCELE);  Surgeon: Robley Fries, MD;  Location: [facility];  Service: Gynecology;;  . SHOULDER ARTHROSCOPY    . VAGINAL PROLAPSE REPAIR  2014    Social History   Tobacco Use  . Smoking status: Never Smoker  . Smokeless tobacco: Never Used  Substance Use Topics  . Alcohol use: Yes    Comment: occassional  . Drug use: No    Family History  Problem Relation Age of Onset  . Heart disease Mother   . Stroke Mother   . Arthritis Mother   . Hyperlipidemia Mother   . Intellectual disability Mother   . Hyperlipidemia Father   . Heart disease Maternal Grandmother   . Cancer Maternal Grandfather   . Cancer Paternal Grandmother   . Hearing loss Paternal Grandfather     Allergies  Allergen Reactions  . Sulfa Antibiotics Swelling    Medication list has been reviewed and updated.  Current Outpatient Medications  on File Prior to Visit  Medication Sig Dispense Refill  . ibuprofen (ADVIL,MOTRIN) 200 MG tablet Take 200 mg by mouth every 6 (six) hours as needed for headache.    . levonorgestrel (MIRENA) 20 MCG/24HR IUD 1 each by Intrauterine route once.     No current facility-administered medications on file prior to visit.    Review of Systems:  As per HPI- otherwise negative.   Physical Examination: Vitals:   05/08/19 1612  BP: 116/79  Pulse: 69  Resp: 16  Temp: 98.4 F (36.9 C)  SpO2: 98%   Vitals:   05/08/19 1612  Weight: 173 lb (78.5 kg)  Height: 5\' 7"  (1.702 m)   Body mass index is 27.1 kg/m. Ideal Body Weight: Weight in (lb) to have BMI = 25: 159.3  GEN: no acute distress.  Mild overweight, looks well  HEENT: Atraumatic, Normocephalic.  Ears and Nose: No external  deformity. CV: RRR, No M/G/R. No JVD. No thrill. No extra heart sounds. PULM: CTA B, no wheezes, crackles, rhonchi. No retractions. No resp. distress. No accessory muscle use. ABD: S, NT, ND, +BS. No rebound. No HSM. EXTR: No c/c/e PSYCH: Normally interactive. Conversant.  Left buttock displays a recnet insect bite- no EM rash or other rash noted Pt brings in 2 ticks with her today- one long star and the other is I think a deer tick   Assessment and Plan: Tick bite of buttock, initial encounter - Plan: doxycycline (VIBRAMYCIN) 100 MG capsule  Screening for deficiency anemia - Plan: CBC  Screening for diabetes mellitus - Plan: Comprehensive metabolic panel, Hemoglobin A1c  Screening for thyroid disorder - Plan: TSH  Screening for hyperlipidemia - Plan: Lipid panel  Concern of tick bite today rx for doxy- pt plans to take 200 mg once for tick borne illness prophylaxis, she will let me know if any other symptoms such as fever or rash appear  Will plan further follow- up pending labs. Moderate medical decision making today  This visit occurred during the SARS-CoV-2 public health emergency.  Safety protocols were in place, including screening questions prior to the visit, additional usage of staff PPE, and extensive cleaning of exam room while observing appropriate contact time as indicated for disinfecting solutions.    Signed , MD  Addendum 4/22, received her labs as below.  Message to patient  Results for orders placed or performed in visit on 05/08/19  CBC  Result Value Ref Range   WBC 7.8 4.0 - 10.5 K/uL   RBC 4.38 3.87 - 5.11 Mil/uL   Platelets 226.0 150.0 - 400.0 K/uL   Hemoglobin 13.5 12.0 - 15.0 g/dL   HCT 05/10/19 94.8 - 54.6 %   MCV 90.2 78.0 - 100.0 fl   MCHC 34.3 30.0 - 36.0 g/dL   RDW 27.0 35.0 - 09.3 %  Comprehensive metabolic panel  Result Value Ref Range   Sodium 137 135 - 145 mEq/L   Potassium 3.7 3.5 - 5.1 mEq/L   Chloride 102 96 - 112 mEq/L    CO2 29 19 - 32 mEq/L   Glucose, Bld 82 70 - 99 mg/dL   BUN 9 6 - 23 mg/dL   Creatinine, Ser 81.8 0.40 - 1.20 mg/dL   Total Bilirubin 0.5 0.2 - 1.2 mg/dL   Alkaline Phosphatase 52 39 - 117 U/L   AST 22 0 - 37 U/L   ALT 11 0 - 35 U/L   Total Protein 7.0 6.0 - 8.3 g/dL   Albumin 4.3  3.5 - 5.2 g/dL   GFR 84.20 >60.00 mL/min   Calcium 9.2 8.4 - 10.5 mg/dL  Hemoglobin A1c  Result Value Ref Range   Hgb A1c MFr Bld 5.3 4.6 - 6.5 %  Lipid panel  Result Value Ref Range   Cholesterol 207 (H) 0 - 200 mg/dL   Triglycerides 91.0 0.0 - 149.0 mg/dL   HDL 51.50 >39.00 mg/dL   VLDL 18.2 0.0 - 40.0 mg/dL   LDL Cholesterol 137 (H) 0 - 99 mg/dL   Total CHOL/HDL Ratio 4    NonHDL 155.25   TSH  Result Value Ref Range   TSH 2.28 0.35 - 4.50 uIU/mL

## 2019-05-08 NOTE — Patient Instructions (Addendum)
It was good to see you again today- I will be in touch with your labs I gave you an rx for doxycyline to help prevent lyme disease or other tick borne illness- take 200 mg once, you can also take the full course of 100 mg twice a day for 5 days if you like  Let me know if any other symptoms or rashes

## 2019-05-09 ENCOUNTER — Encounter: Payer: Self-pay | Admitting: Family Medicine

## 2019-05-09 LAB — LIPID PANEL
Cholesterol: 207 mg/dL — ABNORMAL HIGH (ref 0–200)
HDL: 51.5 mg/dL (ref >39.00)
LDL Cholesterol: 137 mg/dL — ABNORMAL HIGH (ref 0–99)
NonHDL: 155.25
Total CHOL/HDL Ratio: 4
Triglycerides: 91 mg/dL (ref 0.0–149.0)
VLDL: 18.2 mg/dL (ref 0.0–40.0)

## 2019-05-09 LAB — COMPREHENSIVE METABOLIC PANEL
ALT: 11 U/L (ref 0–35)
AST: 22 U/L (ref 0–37)
Albumin: 4.3 g/dL (ref 3.5–5.2)
Alkaline Phosphatase: 52 U/L (ref 39–117)
BUN: 9 mg/dL (ref 6–23)
CO2: 29 mEq/L (ref 19–32)
Calcium: 9.2 mg/dL (ref 8.4–10.5)
Chloride: 102 mEq/L (ref 96–112)
Creatinine, Ser: 0.74 mg/dL (ref 0.40–1.20)
GFR: 84.2 mL/min (ref >60.00)
Glucose, Bld: 82 mg/dL (ref 70–99)
Potassium: 3.7 mEq/L (ref 3.5–5.1)
Sodium: 137 mEq/L (ref 135–145)
Total Bilirubin: 0.5 mg/dL (ref 0.2–1.2)
Total Protein: 7 g/dL (ref 6.0–8.3)

## 2019-05-09 LAB — CBC
HCT: 39.5 % (ref 36.0–46.0)
Hemoglobin: 13.5 g/dL (ref 12.0–15.0)
MCHC: 34.3 g/dL (ref 30.0–36.0)
MCV: 90.2 fl (ref 78.0–100.0)
Platelets: 226 10*3/uL (ref 150.0–400.0)
RBC: 4.38 Mil/uL (ref 3.87–5.11)
RDW: 13.1 % (ref 11.5–15.5)
WBC: 7.8 10*3/uL (ref 4.0–10.5)

## 2019-05-09 LAB — TSH: TSH: 2.28 u[IU]/mL (ref 0.35–4.50)

## 2019-05-09 LAB — HEMOGLOBIN A1C: Hgb A1c MFr Bld: 5.3 % (ref 4.6–6.5)

## 2020-09-25 NOTE — Progress Notes (Addendum)
[facility] [address] [place], [place] [address] [phone]  Date:  09/28/2020   Name:  Rebecca Key   DOB:  [birth year]   MRN:  [id number]  PCP:  Pearline Cables, MD    Chief Complaint: Annual Exam (Sees gyn)   History of Present Illness:  Rebecca Key is a 50 y.o. 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 4th and 6th Married to [place] She has GYN care per Dr. Michela Pitcher and mammogram are up-to-date per her report.  I will request these records  Hep C screening- will do today  Colon cancer screening- discussed today. No family history.  She prefers Cologuard Covid booster- she plans to do this ASAP Pap- per GYN  Mammo 7/22- per Dr Tildon Husky  Flu vaccine- give today  She is fasting today except for 2 grapes   Never smoker, occasional alcohol She plans to work on getting more exercise Patient Active Problem List   Diagnosis Date Noted   Cystocele, grade 3 04/18/2013    Past Medical History:  Diagnosis Date   Active labor 02/02/2011   Allergy    Anemia    with pregnancy   Chicken pox    Headache(784.0)    occ   History of fainting spells of unknown cause    Inguinal hernia    No pertinent past medical history    PP care - s/p SVD 1/16 02/03/2011   Vaginal delivery 2010, 2013    Past Surgical History:  Procedure Laterality Date   BLADDER SUSPENSION N/A 04/18/2013   Procedure: TRANSVAGINAL TAPE (TVT) Exact PROCEDURE;  Surgeon: Robley Fries, MD;  Location: [facility];  Service: Gynecology;  Laterality: N/A;   CYSTOCELE REPAIR N/A 04/18/2013   Procedure: ANTERIOR REPAIR (CYSTOCELE); Anterior Colporrhaphy;  Surgeon: Robley Fries, MD;  Location: [facility];  Service: Gynecology;  Laterality: N/A;  90 min.   CYSTOSCOPY N/A 04/18/2013   Procedure:  CYSTOSCOPY;  Surgeon: Robley Fries, MD;  Location: [facility];  Service: Gynecology;  Laterality: N/A;   HERNIA REPAIR     MANDIBLE FRACTURE SURGERY     RECTOCELE REPAIR  04/18/2013   Procedure: POSTERIOR REPAIR (RECTOCELE);  Surgeon: Robley Fries, MD;  Location: [facility];  Service: Gynecology;;   SHOULDER ARTHROSCOPY     VAGINAL PROLAPSE REPAIR  2014    Social History   Tobacco Use   Smoking status: Never   Smokeless tobacco: Never  Substance Use Topics   Alcohol use: Yes    Comment: occassional   Drug use: No    Family History  Problem Relation Age of Onset   Heart disease Mother    Stroke Mother    Arthritis Mother    Hyperlipidemia Mother    Intellectual disability Mother    Hyperlipidemia Father    Heart disease Maternal Grandmother    Cancer Maternal Grandfather    Cancer Paternal Grandmother    Hearing loss Paternal Grandfather     Allergies  Allergen Reactions   Sulfa Antibiotics Swelling    Medication list has been reviewed and updated.  Current Outpatient Medications on File Prior to Visit  Medication Sig Dispense Refill   levonorgestrel (MIRENA) 20 MCG/24HR IUD 1 each by Intrauterine route once.  No current facility-administered medications on file prior to visit.    Review of Systems:  As per HPI- otherwise negative.   Physical Examination: Vitals:   09/28/20 0846  BP: 126/86  Pulse: 68  Resp: 15  Temp: 98.1 F (36.7 C)  SpO2: 99%   Vitals:   09/28/20 0846  Weight: 168 lb (76.2 kg)  Height: 5\' 7"  (1.702 m)   Body mass index is 26.31 kg/m. Ideal Body Weight: Weight in (lb) to have BMI = 25: 159.3  GEN: no acute distress.  Mild overweight, looks well HEENT: Atraumatic, Normocephalic.  TM wnl. PEERL Ears and Nose: No external deformity. CV: RRR, No M/G/R. No JVD. No thrill. No extra heart sounds. PULM: CTA B, no wheezes, crackles, rhonchi. No retractions. No resp. distress. No accessory muscle use. ABD: S, NT, ND, +BS. No rebound.  No HSM. EXTR: No c/c/e PSYCH: Normally interactive. Conversant.    Assessment and Plan: Physical exam  Screening for thyroid disorder - Plan: TSH  Screening for hyperlipidemia - Plan: Lipid panel  Screening for deficiency anemia - Plan: CBC  Screening for diabetes mellitus - Plan: Comprehensive metabolic panel, Hemoglobin A1c  Encounter for hepatitis C screening test for low risk patient - Plan: Hepatitis C antibody  Fatigue, unspecified type - Plan: TSH, VITAMIN D 25 Hydroxy (Vit-D Deficiency, Fractures)  Screening for colon cancer - Plan: Cologuard Physical exam today.  Encouraged healthy diet and exercise routine Update tetanus and flu vaccines Will plan further follow- up pending labs. Order Cologuard  This visit occurred during the SARS-CoV-2 public health emergency.  Safety protocols were in place, including screening questions prior to the visit, additional usage of staff PPE, and extensive cleaning of exam room while observing appropriate contact time as indicated for disinfecting solutions.   Signed , MD  Received labs as below, message to patient  Results for orders placed or performed in visit on 09/28/20  CBC  Result Value Ref Range   WBC 6.5 4.0 - 10.5 K/uL   RBC 4.58 3.87 - 5.11 Mil/uL   Platelets 233.0 150.0 - 400.0 K/uL   Hemoglobin 14.3 12.0 - 15.0 g/dL   HCT 11/28/20 40.9 - 81.1 %   MCV 91.4 78.0 - 100.0 fl   MCHC 34.0 30.0 - 36.0 g/dL   RDW 91.4 78.2 - 95.6 %  Comprehensive metabolic panel  Result Value Ref Range   Sodium 139 135 - 145 mEq/L   Potassium 3.9 3.5 - 5.1 mEq/L   Chloride 100 96 - 112 mEq/L   CO2 30 19 - 32 mEq/L   Glucose, Bld 87 70 - 99 mg/dL   BUN 10 6 - 23 mg/dL   Creatinine, Ser 21.3 0.40 - 1.20 mg/dL   Total Bilirubin 0.8 0.2 - 1.2 mg/dL   Alkaline Phosphatase 59 39 - 117 U/L   AST 22 0 - 37 U/L   ALT 11 0 - 35 U/L   Total Protein 7.1 6.0 - 8.3 g/dL   Albumin 4.3 3.5 - 5.2 g/dL   GFR 0.86 57.84 mL/min    Calcium 9.4 8.4 - 10.5 mg/dL  Hemoglobin >69.62  Result Value Ref Range   Hgb A1c MFr Bld 5.5 4.6 - 6.5 %  Lipid panel  Result Value Ref Range   Cholesterol 250 (H) 0 - 200 mg/dL   Triglycerides X5M 0.0 - 149.0 mg/dL   HDL 841.3 24.40 mg/dL   VLDL >10.27 0.0 - 25.3 mg/dL   LDL Cholesterol 66.4 (H) 0 -  99 mg/dL   Total CHOL/HDL Ratio 5    NonHDL 197.84   TSH  Result Value Ref Range   TSH 2.15 0.35 - 5.50 uIU/mL  VITAMIN D 25 Hydroxy (Vit-D Deficiency, Fractures)  Result Value Ref Range   VITD 21.85 (L) 30.00 - 100.00 ng/mL   Sent in high-dose vitamin D

## 2020-09-25 NOTE — Patient Instructions (Addendum)
Good to see you again today- take care and I will be in touch with your labs asap Flu and tetanus given today Ok to get the new bivalent covid booster at your convenience Cologuard ordered for you today- confirm coverage with [employer] prior to completing the test.  If you need me to change to colonoscopy I can do so  Have a great fall! Work on increasing exercise

## 2020-09-28 ENCOUNTER — Other Ambulatory Visit: Payer: Self-pay

## 2020-09-28 ENCOUNTER — Ambulatory Visit (INDEPENDENT_AMBULATORY_CARE_PROVIDER_SITE_OTHER): Payer: Managed Care, Other (non HMO) | Admitting: Family Medicine

## 2020-09-28 ENCOUNTER — Encounter: Payer: Self-pay | Admitting: Family Medicine

## 2020-09-28 VITALS — BP 126/86 | HR 68 | Temp 98.1°F | Resp 15 | Ht 67.0 in | Wt 168.0 lb

## 2020-09-28 DIAGNOSIS — Z0000 Encounter for general adult medical examination without abnormal findings: Secondary | ICD-10-CM | POA: Diagnosis not present

## 2020-09-28 DIAGNOSIS — Z130 Encounter for screening for diseases of the blood and blood-forming organs and certain disorders involving the immune mechanism: Secondary | ICD-10-CM

## 2020-09-28 DIAGNOSIS — Z13220 Encounter for screening for lipoid disorders: Secondary | ICD-10-CM

## 2020-09-28 DIAGNOSIS — Z23 Encounter for immunization: Secondary | ICD-10-CM

## 2020-09-28 DIAGNOSIS — Z1159 Encounter for screening for other viral diseases: Secondary | ICD-10-CM

## 2020-09-28 DIAGNOSIS — Z131 Encounter for screening for diabetes mellitus: Secondary | ICD-10-CM

## 2020-09-28 DIAGNOSIS — Z1329 Encounter for screening for other suspected endocrine disorder: Secondary | ICD-10-CM

## 2020-09-28 DIAGNOSIS — E559 Vitamin D deficiency, unspecified: Secondary | ICD-10-CM

## 2020-09-28 DIAGNOSIS — R5383 Other fatigue: Secondary | ICD-10-CM | POA: Diagnosis not present

## 2020-09-28 DIAGNOSIS — Z1211 Encounter for screening for malignant neoplasm of colon: Secondary | ICD-10-CM

## 2020-09-28 LAB — LIPID PANEL
Cholesterol: 250 mg/dL — ABNORMAL HIGH (ref 0–200)
HDL: 52 mg/dL (ref >39.00)
LDL Cholesterol: 176 mg/dL — ABNORMAL HIGH (ref 0–99)
NonHDL: 197.84
Total CHOL/HDL Ratio: 5
Triglycerides: 107 mg/dL (ref 0.0–149.0)
VLDL: 21.4 mg/dL (ref 0.0–40.0)

## 2020-09-28 LAB — CBC
HCT: 41.9 % (ref 36.0–46.0)
Hemoglobin: 14.3 g/dL (ref 12.0–15.0)
MCHC: 34 g/dL (ref 30.0–36.0)
MCV: 91.4 fl (ref 78.0–100.0)
Platelets: 233 10*3/uL (ref 150.0–400.0)
RBC: 4.58 Mil/uL (ref 3.87–5.11)
RDW: 12.8 % (ref 11.5–15.5)
WBC: 6.5 10*3/uL (ref 4.0–10.5)

## 2020-09-28 LAB — COMPREHENSIVE METABOLIC PANEL
ALT: 11 U/L (ref 0–35)
AST: 22 U/L (ref 0–37)
Albumin: 4.3 g/dL (ref 3.5–5.2)
Alkaline Phosphatase: 59 U/L (ref 39–117)
BUN: 10 mg/dL (ref 6–23)
CO2: 30 mEq/L (ref 19–32)
Calcium: 9.4 mg/dL (ref 8.4–10.5)
Chloride: 100 mEq/L (ref 96–112)
Creatinine, Ser: 0.72 mg/dL (ref 0.40–1.20)
GFR: 99.04 mL/min (ref >60.00)
Glucose, Bld: 87 mg/dL (ref 70–99)
Potassium: 3.9 mEq/L (ref 3.5–5.1)
Sodium: 139 mEq/L (ref 135–145)
Total Bilirubin: 0.8 mg/dL (ref 0.2–1.2)
Total Protein: 7.1 g/dL (ref 6.0–8.3)

## 2020-09-28 LAB — VITAMIN D 25 HYDROXY (VIT D DEFICIENCY, FRACTURES): VITD: 21.85 ng/mL — ABNORMAL LOW (ref 30.00–100.00)

## 2020-09-28 LAB — HEMOGLOBIN A1C: Hgb A1c MFr Bld: 5.5 % (ref 4.6–6.5)

## 2020-09-28 LAB — TSH: TSH: 2.15 u[IU]/mL (ref 0.35–5.50)

## 2020-09-28 MED ORDER — VITAMIN D3 1.25 MG (50000 UT) PO CAPS: ORAL_CAPSULE | ORAL | 0 refills | Status: DC

## 2020-09-28 NOTE — Addendum Note (Signed)
Addended by: Abbe Amsterdam C on: 09/28/2020 03:24 PM   Modules accepted: Orders

## 2020-09-28 NOTE — Addendum Note (Signed)
Addended by: Steve Rattler A on: 09/28/2020 09:55 AM   Modules accepted: Orders

## 2020-09-29 LAB — HEPATITIS C ANTIBODY
Hepatitis C Ab: NONREACTIVE
SIGNAL TO CUT-OFF: 0.01 (ref <1.00)

## 2021-04-27 DIAGNOSIS — L821 Other seborrheic keratosis: Secondary | ICD-10-CM | POA: Diagnosis not present

## 2021-04-27 DIAGNOSIS — L239 Allergic contact dermatitis, unspecified cause: Secondary | ICD-10-CM | POA: Diagnosis not present

## 2021-04-27 DIAGNOSIS — L570 Actinic keratosis: Secondary | ICD-10-CM | POA: Diagnosis not present

## 2021-08-09 DIAGNOSIS — L570 Actinic keratosis: Secondary | ICD-10-CM | POA: Diagnosis not present

## 2021-08-09 DIAGNOSIS — L814 Other melanin hyperpigmentation: Secondary | ICD-10-CM | POA: Diagnosis not present

## 2021-08-09 DIAGNOSIS — C44311 Basal cell carcinoma of skin of nose: Secondary | ICD-10-CM | POA: Diagnosis not present

## 2021-08-09 DIAGNOSIS — C44319 Basal cell carcinoma of skin of other parts of face: Secondary | ICD-10-CM | POA: Diagnosis not present

## 2021-08-09 DIAGNOSIS — B078 Other viral warts: Secondary | ICD-10-CM | POA: Diagnosis not present

## 2021-08-09 DIAGNOSIS — L821 Other seborrheic keratosis: Secondary | ICD-10-CM | POA: Diagnosis not present

## 2021-08-11 DIAGNOSIS — Z01419 Encounter for gynecological examination (general) (routine) without abnormal findings: Secondary | ICD-10-CM | POA: Diagnosis not present

## 2021-08-11 DIAGNOSIS — Z113 Encounter for screening for infections with a predominantly sexual mode of transmission: Secondary | ICD-10-CM | POA: Diagnosis not present

## 2021-08-11 DIAGNOSIS — Z124 Encounter for screening for malignant neoplasm of cervix: Secondary | ICD-10-CM | POA: Diagnosis not present

## 2021-08-11 DIAGNOSIS — Z1231 Encounter for screening mammogram for malignant neoplasm of breast: Secondary | ICD-10-CM | POA: Diagnosis not present

## 2021-08-11 DIAGNOSIS — Z6827 Body mass index (BMI) 27.0-27.9, adult: Secondary | ICD-10-CM | POA: Diagnosis not present

## 2021-08-16 ENCOUNTER — Encounter: Payer: Self-pay | Admitting: Family Medicine

## 2021-08-16 DIAGNOSIS — D049 Carcinoma in situ of skin, unspecified: Secondary | ICD-10-CM | POA: Insufficient documentation

## 2021-09-17 ENCOUNTER — Telehealth: Payer: Self-pay

## 2021-09-17 ENCOUNTER — Ambulatory Visit: Payer: Self-pay | Admitting: Family

## 2021-09-17 ENCOUNTER — Encounter: Payer: Self-pay | Admitting: Family Medicine

## 2021-09-17 NOTE — Telephone Encounter (Signed)
Pt wanted to get an earlier appointment due to her husbands recent passing. She is requesting FMLA forms to be completed. I have scheduled the pt for 09/22/21 at 10 am.   Pt stated understanding.

## 2021-09-19 NOTE — Progress Notes (Unsigned)
[facility] at [facility] [address] [place], [place] [address] [phone]  Date:  09/22/2021   Name:  Rebecca Key   DOB:  1970-08-18   MRN:  [id number]  PCP:  Pearline Cables, MD    Chief Complaint: fmla (Concerns/ questions: /)   History of Present Illness:  Rebecca Key is a 51 y.o. very pleasant female patient who presents with the following:  Generally healthy young woman seen today following the recent death of her husband Rebecca Key She has 2 children ages 28 and 73- 9th and 7th grade. They attend [employer]   Her husband Rebecca Key was dx with stage 4 colon cancer last Nov 13, 2022 and died September 26, 2021 after 1 week and hospice He was 51 yo This has of course been terribly hard They were married for 13 years   She is sleeping pretty well actually, at this point does not wish to start any medication They have lots of friends supporting them Both of her parents are already passed-her mother-in-law arranged.  However she is fairly elderly -in addition to being in shock herself- and has not been able to offer too much assistance She has not thought about doing counseling for herself just yet.  However, she would like to take some FMLA leave from her job.  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 and is not able to support her clients effectively right now   Patient Active Problem List   Diagnosis Date Noted   Basal cell carcinoma (BCC) in situ of skin 08/16/2021   Cystocele, grade 3 04/18/2013    Past Medical History:  Diagnosis Date   Active labor 02/02/2011   Allergy    Anemia    with pregnancy   Chicken pox    Headache(784.0)    occ   History of fainting spells of unknown cause    Inguinal hernia    No pertinent past medical history    PP care - s/p SVD 1/16 02/03/2011   Vaginal delivery 2010, 2013    Past Surgical History:  Procedure Laterality Date   BLADDER SUSPENSION N/A 04/18/2013    Procedure: TRANSVAGINAL TAPE (TVT) Exact PROCEDURE;  Surgeon: Robley Fries, MD;  Location: [facility];  Service: Gynecology;  Laterality: N/A;   CYSTOCELE REPAIR N/A 04/18/2013   Procedure: ANTERIOR REPAIR (CYSTOCELE); Anterior Colporrhaphy;  Surgeon: Robley Fries, MD;  Location: [facility];  Service: Gynecology;  Laterality: N/A;  90 min.   CYSTOSCOPY N/A 04/18/2013   Procedure: CYSTOSCOPY;  Surgeon: Robley Fries, MD;  Location: [facility];  Service: Gynecology;  Laterality: N/A;   HERNIA REPAIR     MANDIBLE FRACTURE SURGERY     RECTOCELE REPAIR  04/18/2013   Procedure: POSTERIOR REPAIR (RECTOCELE);  Surgeon: Robley Fries, MD;  Location: [facility];  Service: Gynecology;;   SHOULDER ARTHROSCOPY     VAGINAL PROLAPSE REPAIR  2014    Social History   Tobacco Use   Smoking status: Never   Smokeless tobacco: Never  Substance Use Topics   Alcohol use: Yes    Comment: occassional   Drug use: No    Family History  Problem Relation Age of Onset   Heart disease Mother    Stroke Mother    Arthritis Mother    Hyperlipidemia Mother    Intellectual disability Mother    Hyperlipidemia Father    Heart disease Maternal Grandmother    Cancer Maternal  Grandfather    Cancer Paternal Grandmother    Hearing loss Paternal Grandfather     Allergies  Allergen Reactions   Sulfa Antibiotics Swelling    Medication list has been reviewed and updated.  Current Outpatient Medications on File Prior to Visit  Medication Sig Dispense Refill   levonorgestrel (MIRENA) 20 MCG/24HR IUD 1 each by Intrauterine route once.     No current facility-administered medications on file prior to visit.    Review of Systems:  As per HPI- otherwise negative.   Physical Examination: Vitals:   09/22/21 0950  BP: 110/64  Pulse: 78  Resp: 18  Temp: 97.6 F (36.4 C)  SpO2: 98%   Vitals:   09/22/21 0950  Height: 5\' 7"  (1.702 m)   Body mass index is 26.31 kg/m. Ideal Body Weight: Weight in (lb) to have BMI =  25: 159.3 GEN: No acute distress; alert,appropriate. PULM: Breathing comfortably in no respiratory distress PSYCH: Normally interactive.    Assessment and Plan: Bereavement  Screening for colon cancer - Plan: Ambulatory referral to Gastroenterology  Patient seen today following the recent loss of her husband to colon cancer.  Tannah feels that she is managing things fairly well, for right now she does not wish to be on medication.  She is seeing me in about 1 week for her physical and we can check in again I completed FMLA paperwork for her, we will have her be out for 11 weeks which is what remains of her FMLA for the year Following that, she may plan to change her work schedule somewhat, perhaps work [employer] or virtually. Offered support and our deepest condolences for her tragic loss today She has not yet been screened for colon cancer, she had plan to do Cologuard but now prefers to go for colonoscopy.  I placed an order today  Signed Therapist, sports, MD

## 2021-09-22 ENCOUNTER — Ambulatory Visit: Payer: BC Managed Care – PPO | Admitting: Family Medicine

## 2021-09-22 VITALS — BP 110/64 | HR 78 | Temp 97.6°F | Resp 18 | Ht 67.0 in

## 2021-09-22 DIAGNOSIS — Z1211 Encounter for screening for malignant neoplasm of colon: Secondary | ICD-10-CM

## 2021-09-22 DIAGNOSIS — Z634 Disappearance and death of family member: Secondary | ICD-10-CM

## 2021-09-22 NOTE — Patient Instructions (Signed)
I am so sorry for the loss of Rebecca Key.  I will see you next week Referral made to [facility]  Address: [address], [place], [place] [address] Phone: [phone] If you are not ok or need medication please call me

## 2021-09-25 NOTE — Progress Notes (Unsigned)
[facility] at [facility] [address], [address] [place], [place] [address] [phone]  Date:  09/29/2021   Name:  Rebecca Key   DOB:  09-07-1970   MRN:  [id number]  PCP:  Pearline Cables, MD    Chief Complaint: No chief complaint on file.   History of Present Illness:  Rebecca Key is a 51 y.o. very pleasant female patient who presents with the following:  Pt seen today for a CPE She is generally health- however, her husband passed away recently after a brief struggle with colon cancer: Generally healthy young woman seen today following the recent death of her husband Rebecca Key She has 2 children ages 69 and 61- 60th and 7th grade. They attend [employer]  Her husband Rebecca Key was dx with stage 4 colon cancer last 11/11/2022 and died 09-23-2021 after 1 week and hospice He was 51 yo This has of course been terribly hard They were married for 13 years  She is sleeping pretty well actually, at this point does not wish to start any medication They have lots of friends supporting them  Colon cancer screening- I placed an order for a GI consult at our last visit Covid booster and flu shot  Can update labs today  Mammo UTD pap Patient Active Problem List   Diagnosis Date Noted   Basal cell carcinoma (BCC) in situ of skin 08/16/2021   Cystocele, grade 3 04/18/2013    Past Medical History:  Diagnosis Date   Active labor 02/02/2011   Allergy    Anemia    with pregnancy   Chicken pox    Headache(784.0)    occ   History of fainting spells of unknown cause    Inguinal hernia    No pertinent past medical history    PP care - s/p SVD 1/16 02/03/2011   Vaginal delivery 2010, 2013    Past Surgical History:  Procedure Laterality Date   BLADDER SUSPENSION N/A 04/18/2013   Procedure: TRANSVAGINAL TAPE (TVT) Exact PROCEDURE;  Surgeon: Robley Fries, MD;  Location: [facility];  Service: Gynecology;  Laterality: N/A;   CYSTOCELE REPAIR  N/A 04/18/2013   Procedure: ANTERIOR REPAIR (CYSTOCELE); Anterior Colporrhaphy;  Surgeon: Robley Fries, MD;  Location: [facility];  Service: Gynecology;  Laterality: N/A;  90 min.   CYSTOSCOPY N/A 04/18/2013   Procedure: CYSTOSCOPY;  Surgeon: Robley Fries, MD;  Location: [facility];  Service: Gynecology;  Laterality: N/A;   HERNIA REPAIR     MANDIBLE FRACTURE SURGERY     RECTOCELE REPAIR  04/18/2013   Procedure: POSTERIOR REPAIR (RECTOCELE);  Surgeon: Robley Fries, MD;  Location: [facility];  Service: Gynecology;;   SHOULDER ARTHROSCOPY     VAGINAL PROLAPSE REPAIR  2014    Social History   Tobacco Use   Smoking status: Never   Smokeless tobacco: Never  Substance Use Topics   Alcohol use: Yes    Comment: occassional   Drug use: No    Family History  Problem Relation Age of Onset   Heart disease Mother    Stroke Mother    Arthritis Mother    Hyperlipidemia Mother    Intellectual disability Mother    Hyperlipidemia Father    Heart disease Maternal Grandmother    Cancer Maternal Grandfather    Cancer Paternal Grandmother    Hearing loss Paternal Grandfather     Allergies  Allergen Reactions   Sulfa Antibiotics  Swelling    Medication list has been reviewed and updated.  Current Outpatient Medications on File Prior to Visit  Medication Sig Dispense Refill   levonorgestrel (MIRENA) 20 MCG/24HR IUD 1 each by Intrauterine route once.     No current facility-administered medications on file prior to visit.    Review of Systems:  As per HPI- otherwise negative.   Physical Examination: There were no vitals filed for this visit. There were no vitals filed for this visit. There is no height or weight on file to calculate BMI. Ideal Body Weight:    GEN: no acute distress. HEENT: Atraumatic, Normocephalic.  Ears and Nose: No external deformity. CV: RRR, No M/G/R. No JVD. No thrill. No extra heart sounds. PULM: CTA B, no wheezes, crackles, rhonchi. No retractions. No resp.  distress. No accessory muscle use. ABD: S, NT, ND, +BS. No rebound. No HSM. EXTR: No c/c/e PSYCH: Normally interactive. Conversant.    Assessment and Plan: *** Physical exam today Encourage healthy diet and exercise routine  Signed Rebecca Amsterdam, MD

## 2021-09-29 ENCOUNTER — Encounter: Payer: Self-pay | Admitting: Family Medicine

## 2021-09-29 ENCOUNTER — Ambulatory Visit (INDEPENDENT_AMBULATORY_CARE_PROVIDER_SITE_OTHER): Payer: BC Managed Care – PPO | Admitting: Family Medicine

## 2021-09-29 VITALS — BP 112/60 | HR 71 | Temp 97.8°F | Resp 18 | Ht 67.0 in | Wt 169.2 lb

## 2021-09-29 DIAGNOSIS — Z1329 Encounter for screening for other suspected endocrine disorder: Secondary | ICD-10-CM

## 2021-09-29 DIAGNOSIS — E559 Vitamin D deficiency, unspecified: Secondary | ICD-10-CM

## 2021-09-29 DIAGNOSIS — R5383 Other fatigue: Secondary | ICD-10-CM | POA: Diagnosis not present

## 2021-09-29 DIAGNOSIS — J029 Acute pharyngitis, unspecified: Secondary | ICD-10-CM

## 2021-09-29 DIAGNOSIS — Z634 Disappearance and death of family member: Secondary | ICD-10-CM

## 2021-09-29 DIAGNOSIS — Z13220 Encounter for screening for lipoid disorders: Secondary | ICD-10-CM | POA: Diagnosis not present

## 2021-09-29 DIAGNOSIS — Z0000 Encounter for general adult medical examination without abnormal findings: Secondary | ICD-10-CM | POA: Diagnosis not present

## 2021-09-29 DIAGNOSIS — Z130 Encounter for screening for diseases of the blood and blood-forming organs and certain disorders involving the immune mechanism: Secondary | ICD-10-CM

## 2021-09-29 DIAGNOSIS — Z131 Encounter for screening for diabetes mellitus: Secondary | ICD-10-CM | POA: Diagnosis not present

## 2021-09-29 LAB — COMPREHENSIVE METABOLIC PANEL
ALT: 14 U/L (ref 0–35)
AST: 24 U/L (ref 0–37)
Albumin: 4 g/dL (ref 3.5–5.2)
Alkaline Phosphatase: 62 U/L (ref 39–117)
BUN: 13 mg/dL (ref 6–23)
CO2: 28 mEq/L (ref 19–32)
Calcium: 9.1 mg/dL (ref 8.4–10.5)
Chloride: 99 mEq/L (ref 96–112)
Creatinine, Ser: 0.7 mg/dL (ref 0.40–1.20)
GFR: 101.72 mL/min (ref >60.00)
Glucose, Bld: 75 mg/dL (ref 70–99)
Potassium: 3.9 mEq/L (ref 3.5–5.1)
Sodium: 137 mEq/L (ref 135–145)
Total Bilirubin: 0.7 mg/dL (ref 0.2–1.2)
Total Protein: 6.9 g/dL (ref 6.0–8.3)

## 2021-09-29 LAB — LIPID PANEL
Cholesterol: 210 mg/dL — ABNORMAL HIGH (ref 0–200)
HDL: 49.9 mg/dL (ref >39.00)
LDL Cholesterol: 140 mg/dL — ABNORMAL HIGH (ref 0–99)
NonHDL: 159.96
Total CHOL/HDL Ratio: 4
Triglycerides: 102 mg/dL (ref 0.0–149.0)
VLDL: 20.4 mg/dL (ref 0.0–40.0)

## 2021-09-29 LAB — CBC
HCT: 40.9 % (ref 36.0–46.0)
Hemoglobin: 13.7 g/dL (ref 12.0–15.0)
MCHC: 33.5 g/dL (ref 30.0–36.0)
MCV: 91.6 fl (ref 78.0–100.0)
Platelets: 213 10*3/uL (ref 150.0–400.0)
RBC: 4.47 Mil/uL (ref 3.87–5.11)
RDW: 13 % (ref 11.5–15.5)
WBC: 5.7 10*3/uL (ref 4.0–10.5)

## 2021-09-29 LAB — VITAMIN D 25 HYDROXY (VIT D DEFICIENCY, FRACTURES): VITD: 19.67 ng/mL — ABNORMAL LOW (ref 30.00–100.00)

## 2021-09-29 LAB — HEMOGLOBIN A1C: Hgb A1c MFr Bld: 5.7 % (ref 4.6–6.5)

## 2021-09-29 LAB — TSH: TSH: 2 u[IU]/mL (ref 0.35–5.50)

## 2021-09-29 LAB — POC COVID19 BINAXNOW: SARS Coronavirus 2 Ag: POSITIVE — AB

## 2021-09-29 MED ORDER — VITAMIN D3 1.25 MG (50000 UT) PO CAPS: ORAL_CAPSULE | ORAL | 0 refills | Status: DC

## 2021-09-29 NOTE — Patient Instructions (Addendum)
It was good to see you again today- I will be in touch with your labs asap  You do have covid 19- let me know if any other symptoms develop or if you have any concerns  Continue to mask until 10 days from start of symptoms  Please let me know if you do wish to use any medication for anxiety/ depression/ insomnia Please check in with me in a month or so and let me know how you are doing   Flu shot and covid booster later this fall

## 2021-10-11 DIAGNOSIS — C4401 Basal cell carcinoma of skin of lip: Secondary | ICD-10-CM | POA: Diagnosis not present

## 2021-11-12 ENCOUNTER — Telehealth: Payer: Self-pay | Admitting: Family Medicine

## 2021-11-12 NOTE — Telephone Encounter (Signed)
See below

## 2021-11-12 NOTE — Telephone Encounter (Signed)
Pt would like to adjust her fmla to part time (20 hours) until fmla ends. She would like a call if pcp has any questions.

## 2021-11-13 ENCOUNTER — Encounter: Payer: Self-pay | Admitting: Family Medicine

## 2022-09-01 ENCOUNTER — Encounter (INDEPENDENT_AMBULATORY_CARE_PROVIDER_SITE_OTHER): Payer: Self-pay

## 2022-09-30 NOTE — Patient Instructions (Incomplete)
It was good to see you again today!  2nd dose of shingrix due in 2-6 months Recommend seasonal flu shot and covid booster this fall I think you idea of getting the Gardasil series makes sense and would be safe for you, however I am not sure if your insurance will cover it- perhaps check into insurance coverage of this vaccine series.    Referral made to GI to set up your colonoscopy Best of luck in finding an exercise program that works for you!

## 2022-09-30 NOTE — Progress Notes (Unsigned)
[facility] at [facility] [address] [place], [place] [address] [phone]  Date:  10/05/2022   Name:  Rebecca Key   DOB:  1970/05/13   MRN:  [id number]  PCP:  Pearline Cables, MD    Chief Complaint: No chief complaint on file.   History of Present Illness:  Rebecca Key is a 52 y.o. very pleasant female patient who presents with the following:  Patient seen today for physical exam-history of skin cancer, vitamin D deficiency Most recent visit with myself 1 year ago, also for physical. At that time her husband Thayer Ohm recently passed away in 09-08-23due to stage IV colon cancer   She has 2 school-aged children  Pap screening Mammogram Colon cancer screening Flu shot COVID booster Most recent lab work 1 year ago-vitamin D deficiency Patient Active Problem List   Diagnosis Date Noted   Basal cell carcinoma (BCC) in situ of skin 08/16/2021   Cystocele, grade 3 04/18/2013    Past Medical History:  Diagnosis Date   Active labor 02/02/2011   Allergy    Anemia    with pregnancy   Chicken pox    Headache(784.0)    occ   History of fainting spells of unknown cause    Inguinal hernia    No pertinent past medical history    PP care - s/p SVD 1/16 02/03/2011   Vaginal delivery 2010, 2013    Past Surgical History:  Procedure Laterality Date   BLADDER SUSPENSION N/A 04/18/2013   Procedure: TRANSVAGINAL TAPE (TVT) Exact PROCEDURE;  Surgeon: Robley Fries, MD;  Location: [facility];  Service: Gynecology;  Laterality: N/A;   CYSTOCELE REPAIR N/A 04/18/2013   Procedure: ANTERIOR REPAIR (CYSTOCELE); Anterior Colporrhaphy;  Surgeon: Robley Fries, MD;  Location: [facility];  Service: Gynecology;  Laterality: N/A;  90 min.   CYSTOSCOPY N/A 04/18/2013   Procedure: CYSTOSCOPY;  Surgeon: Robley Fries, MD;  Location: [facility];  Service: Gynecology;  Laterality: N/A;   HERNIA REPAIR     MANDIBLE FRACTURE SURGERY     RECTOCELE  REPAIR  04/18/2013   Procedure: POSTERIOR REPAIR (RECTOCELE);  Surgeon: Robley Fries, MD;  Location: [facility];  Service: Gynecology;;   SHOULDER ARTHROSCOPY     VAGINAL PROLAPSE REPAIR  2014    Social History   Tobacco Use   Smoking status: Never   Smokeless tobacco: Never  Substance Use Topics   Alcohol use: Yes    Comment: occassional   Drug use: No    Family History  Problem Relation Age of Onset   Heart disease Mother    Stroke Mother    Arthritis Mother    Hyperlipidemia Mother    Intellectual disability Mother    Hyperlipidemia Father    Heart disease Maternal Grandmother    Cancer Maternal Grandfather    Cancer Paternal Grandmother    Hearing loss Paternal Grandfather     Allergies  Allergen Reactions   Sulfa Antibiotics Swelling    Medication list has been reviewed and updated.  Current Outpatient Medications on File Prior to Visit  Medication Sig Dispense Refill   Cholecalciferol (VITAMIN D3) 1.25 MG (50000 UT) CAPS Take 1 weekly for 12 weeks 12 capsule 0   levonorgestrel (MIRENA) 20 MCG/24HR IUD 1 each by Intrauterine route once.     No current facility-administered medications on file prior to visit.    Review of Systems:  As  per HPI- otherwise negative.   Physical Examination: There were no vitals filed for this visit. There were no vitals filed for this visit. There is no height or weight on file to calculate BMI. Ideal Body Weight:    GEN: no acute distress. HEENT: Atraumatic, Normocephalic.  Ears and Nose: No external deformity. CV: RRR, No M/G/R. No JVD. No thrill. No extra heart sounds. PULM: CTA B, no wheezes, crackles, rhonchi. No retractions. No resp. distress. No accessory muscle use. ABD: S, NT, ND, +BS. No rebound. No HSM. EXTR: No c/c/e PSYCH: Normally interactive. Conversant.    Assessment and Plan: *** Physical exam today.  Encouraged healthy diet and exercise routine Will plan further follow- up pending  labs.  Signed Abbe Amsterdam, MD

## 2022-10-05 ENCOUNTER — Ambulatory Visit (INDEPENDENT_AMBULATORY_CARE_PROVIDER_SITE_OTHER): Payer: BC Managed Care – PPO | Admitting: Family Medicine

## 2022-10-05 ENCOUNTER — Encounter: Payer: Self-pay | Admitting: Family Medicine

## 2022-10-05 VITALS — BP 112/64 | HR 86 | Temp 97.6°F | Resp 18 | Ht 67.0 in | Wt 189.6 lb

## 2022-10-05 DIAGNOSIS — Z13220 Encounter for screening for lipoid disorders: Secondary | ICD-10-CM | POA: Diagnosis not present

## 2022-10-05 DIAGNOSIS — Z23 Encounter for immunization: Secondary | ICD-10-CM | POA: Diagnosis not present

## 2022-10-05 DIAGNOSIS — Z130 Encounter for screening for diseases of the blood and blood-forming organs and certain disorders involving the immune mechanism: Secondary | ICD-10-CM

## 2022-10-05 DIAGNOSIS — Z131 Encounter for screening for diabetes mellitus: Secondary | ICD-10-CM

## 2022-10-05 DIAGNOSIS — R5383 Other fatigue: Secondary | ICD-10-CM

## 2022-10-05 DIAGNOSIS — Z0000 Encounter for general adult medical examination without abnormal findings: Secondary | ICD-10-CM

## 2022-10-05 DIAGNOSIS — Z1329 Encounter for screening for other suspected endocrine disorder: Secondary | ICD-10-CM

## 2022-10-05 DIAGNOSIS — E559 Vitamin D deficiency, unspecified: Secondary | ICD-10-CM | POA: Diagnosis not present

## 2022-10-05 DIAGNOSIS — Z1211 Encounter for screening for malignant neoplasm of colon: Secondary | ICD-10-CM

## 2022-10-05 LAB — HEMOGLOBIN A1C: Hgb A1c MFr Bld: 5.7 % (ref 4.6–6.5)

## 2022-10-05 LAB — CBC
HCT: 43.9 % (ref 36.0–46.0)
Hemoglobin: 14.3 g/dL (ref 12.0–15.0)
MCHC: 32.7 g/dL (ref 30.0–36.0)
MCV: 92.1 fl (ref 78.0–100.0)
Platelets: 257 10*3/uL (ref 150.0–400.0)
RBC: 4.76 Mil/uL (ref 3.87–5.11)
RDW: 13.7 % (ref 11.5–15.5)
WBC: 7.2 10*3/uL (ref 4.0–10.5)

## 2022-10-05 LAB — LIPID PANEL
Cholesterol: 212 mg/dL — ABNORMAL HIGH (ref 0–200)
HDL: 55.8 mg/dL (ref >39.00)
LDL Cholesterol: 139 mg/dL — ABNORMAL HIGH (ref 0–99)
NonHDL: 156.16
Total CHOL/HDL Ratio: 4
Triglycerides: 86 mg/dL (ref 0.0–149.0)
VLDL: 17.2 mg/dL (ref 0.0–40.0)

## 2022-10-05 LAB — COMPREHENSIVE METABOLIC PANEL WITH GFR
ALT: 12 U/L (ref 0–35)
AST: 23 U/L (ref 0–37)
Albumin: 4.1 g/dL (ref 3.5–5.2)
Alkaline Phosphatase: 74 U/L (ref 39–117)
BUN: 12 mg/dL (ref 6–23)
CO2: 28 meq/L (ref 19–32)
Calcium: 8.9 mg/dL (ref 8.4–10.5)
Chloride: 103 meq/L (ref 96–112)
Creatinine, Ser: 0.78 mg/dL (ref 0.40–1.20)
GFR: 88.7 mL/min (ref >60.00)
Glucose, Bld: 89 mg/dL (ref 70–99)
Potassium: 4.1 meq/L (ref 3.5–5.1)
Sodium: 138 meq/L (ref 135–145)
Total Bilirubin: 0.6 mg/dL (ref 0.2–1.2)
Total Protein: 6.7 g/dL (ref 6.0–8.3)

## 2022-10-05 LAB — VITAMIN D 25 HYDROXY (VIT D DEFICIENCY, FRACTURES): VITD: 21.57 ng/mL — ABNORMAL LOW (ref 30.00–100.00)

## 2022-10-05 LAB — TSH: TSH: 1.39 u[IU]/mL (ref 0.35–5.50)

## 2022-10-18 ENCOUNTER — Encounter: Payer: Self-pay | Admitting: Gastroenterology

## 2022-10-18 ENCOUNTER — Telehealth: Payer: Self-pay | Admitting: Family Medicine

## 2022-10-18 NOTE — Telephone Encounter (Signed)
Pt said she spoke to Dr. Patsy Lager about getting the HPV shot and wanted to know how far apart each shot should be and what the time frame should be between the HPV and shingles vaccine. Please call to advise.

## 2022-10-20 DIAGNOSIS — L821 Other seborrheic keratosis: Secondary | ICD-10-CM | POA: Diagnosis not present

## 2022-10-20 DIAGNOSIS — D2262 Melanocytic nevi of left upper limb, including shoulder: Secondary | ICD-10-CM | POA: Diagnosis not present

## 2022-10-20 DIAGNOSIS — Z85828 Personal history of other malignant neoplasm of skin: Secondary | ICD-10-CM | POA: Diagnosis not present

## 2022-10-20 DIAGNOSIS — L814 Other melanin hyperpigmentation: Secondary | ICD-10-CM | POA: Diagnosis not present

## 2022-10-21 ENCOUNTER — Other Ambulatory Visit: Payer: Self-pay | Admitting: Family Medicine

## 2022-10-21 DIAGNOSIS — Z1211 Encounter for screening for malignant neoplasm of colon: Secondary | ICD-10-CM

## 2022-10-21 DIAGNOSIS — Z1212 Encounter for screening for malignant neoplasm of rectum: Secondary | ICD-10-CM

## 2022-10-21 NOTE — Telephone Encounter (Signed)
Can you advise on timing of the HPV vaccines?

## 2022-10-21 NOTE — Telephone Encounter (Signed)
Sure, please let her know there is no particular need to space her HPV away from shingles vaccine  Gardasil/HPV schedule 1 shot month 0 1 shot 1-2 months later 1 shot at 6 months Total of 3

## 2022-10-21 NOTE — Telephone Encounter (Signed)
Left vm to return call and to make sure she has spoken with insurance.

## 2022-11-07 ENCOUNTER — Ambulatory Visit (AMBULATORY_SURGERY_CENTER): Payer: BC Managed Care – PPO

## 2022-11-07 VITALS — Ht 67.0 in | Wt 180.0 lb

## 2022-11-07 DIAGNOSIS — Z1211 Encounter for screening for malignant neoplasm of colon: Secondary | ICD-10-CM

## 2022-11-07 MED ORDER — NA SULFATE-K SULFATE-MG SULF 17.5-3.13-1.6 GM/177ML PO SOLN
1.0000 | Freq: Once | ORAL | 0 refills | Status: AC
Start: 2022-11-07 — End: 2022-11-07

## 2022-11-07 NOTE — Progress Notes (Signed)

## 2022-11-16 ENCOUNTER — Encounter: Payer: Self-pay | Admitting: Gastroenterology

## 2022-11-28 ENCOUNTER — Encounter: Payer: Self-pay | Admitting: Gastroenterology

## 2022-11-28 ENCOUNTER — Ambulatory Visit (AMBULATORY_SURGERY_CENTER): Payer: BC Managed Care – PPO | Admitting: Gastroenterology

## 2022-11-28 VITALS — BP 118/73 | HR 62 | Temp 98.1°F | Resp 21 | Ht 67.0 in | Wt 185.6 lb

## 2022-11-28 DIAGNOSIS — Z1211 Encounter for screening for malignant neoplasm of colon: Secondary | ICD-10-CM

## 2022-11-28 MED ORDER — SODIUM CHLORIDE 0.9 % IV SOLN: 500.0000 mL | INTRAVENOUS | Status: DC

## 2022-11-28 NOTE — Progress Notes (Signed)
Report to PACU, RN, vss, BBS= Clear.  

## 2022-11-28 NOTE — Progress Notes (Signed)
History and Physical:  This patient presents for endoscopic testing for: Encounter Diagnosis  Name Primary?   Special screening for malignant neoplasms, colon Yes    Average risk for colorectal cancer.  First screening exam.  Patient denies chronic abdominal pain, rectal bleeding, constipation or diarrhea.   Patient is otherwise without complaints or active issues today.   Past Medical History: Past Medical History:  Diagnosis Date   Active labor 02/02/2011   Allergy    Anemia    with pregnancy   Chicken pox    Headache(784.0)    occ   History of fainting spells of unknown cause    Inguinal hernia    No pertinent past medical history    PP care - s/p SVD 1/16 02/03/2011   Vaginal delivery 2010, 2013     Past Surgical History: Past Surgical History:  Procedure Laterality Date   BLADDER SUSPENSION N/A 04/18/2013   Procedure: TRANSVAGINAL TAPE (TVT) Exact PROCEDURE;  Surgeon: Robley Fries, MD;  Location: [facility];  Service: Gynecology;  Laterality: N/A;   CYSTOCELE REPAIR N/A 04/18/2013   Procedure: ANTERIOR REPAIR (CYSTOCELE); Anterior Colporrhaphy;  Surgeon: Robley Fries, MD;  Location: [facility];  Service: Gynecology;  Laterality: N/A;  90 min.   CYSTOSCOPY N/A 04/18/2013   Procedure: CYSTOSCOPY;  Surgeon: Robley Fries, MD;  Location: [facility];  Service: Gynecology;  Laterality: N/A;   HERNIA REPAIR     MANDIBLE FRACTURE SURGERY     RECTOCELE REPAIR  04/18/2013   Procedure: POSTERIOR REPAIR (RECTOCELE);  Surgeon: Robley Fries, MD;  Location: [facility];  Service: Gynecology;;   SHOULDER ARTHROSCOPY     VAGINAL PROLAPSE REPAIR  2014    Allergies: Allergies  Allergen Reactions   Sulfa Antibiotics Swelling   Sulfamethoxazole Swelling    Outpatient Meds: Current Outpatient Medications  Medication Sig Dispense Refill   levonorgestrel (MIRENA) 20 MCG/24HR IUD 1 each by Intrauterine route once.     Current Facility-Administered Medications  Medication Dose Route  Frequency Provider Last Rate Last Admin   0.9 %  sodium chloride infusion  500 mL Intravenous Continuous Charlie Pitter III, MD          ___________________________________________________________________ Objective   Exam:  BP 124/72   Pulse 77   Temp 98.1 F (36.7 C) (Temporal)   Ht 5\' 7"  (1.702 m)   Wt 185 lb 9.6 oz (84.2 kg)   SpO2 98%   BMI 29.07 kg/m   CV: regular , S1/S2 Resp: clear to auscultation bilaterally, normal RR and effort noted GI: soft, no tenderness, with active bowel sounds.   Assessment: Encounter Diagnosis  Name Primary?   Special screening for malignant neoplasms, colon Yes     Plan: Colonoscopy   The benefits and risks of the planned procedure were described in detail with the patient or (when appropriate) their health care proxy.  Risks were outlined as including, but not limited to, bleeding, infection, perforation, adverse medication reaction leading to cardiac or pulmonary decompensation, pancreatitis (if ERCP).  The limitation of incomplete mucosal visualization was also discussed.  No guarantees or warranties were given.  The patient is appropriate for an endoscopic procedure in the ambulatory setting.   - Amada Jupiter, MD

## 2022-11-28 NOTE — Patient Instructions (Addendum)
- Patient has a contact number available for                            emergencies. The signs and symptoms of potential                            delayed complications were discussed with the                            patient. Return to normal activities tomorrow.                            Written discharge instructions were provided to the                            patient.                           - Resume previous diet.                           - Continue present medications.                           - Repeat colonoscopy in 10 years for screening                            purposes.                            - Handouts on findings given to patient (hemorrhoids)  YOU HAD AN ENDOSCOPIC PROCEDURE TODAY AT THE [facility]:   Refer to the procedure report that was given to you for any specific questions about what was found during the examination.  If the procedure report does not answer your questions, please call your gastroenterologist to clarify.  If you requested that your care partner not be given the details of your procedure findings, then the procedure report has been included in a sealed envelope for you to review at your convenience later.  YOU SHOULD EXPECT: Some feelings of bloating in the abdomen. Passage of more gas than usual.  Walking can help get rid of the air that was put into your GI tract during the procedure and reduce the bloating. If you had a lower endoscopy (such as a colonoscopy or flexible sigmoidoscopy) you may notice spotting of blood in your stool or on the toilet paper. If you underwent a bowel prep for your procedure, you may not have a normal bowel movement for a few days.  Please Note:  You might notice some irritation and congestion in your nose or some drainage.  This is from the oxygen used during your procedure.  There is no need for concern and it should clear up in a day or so.  SYMPTOMS TO REPORT  IMMEDIATELY:  Following lower endoscopy (colonoscopy or flexible sigmoidoscopy):  Excessive amounts of blood in the stool  Significant tenderness or worsening of abdominal pains  Swelling of the abdomen that is new, acute  Fever of 100F or higher   For urgent or emergent issues, a gastroenterologist  can be reached at any hour by calling [phone]. Do not use MyChart messaging for urgent concerns.    DIET:  We do recommend a small meal at first, but then you may proceed to your regular diet.  Drink plenty of fluids but you should avoid alcoholic beverages for 24 hours.  ACTIVITY:  You should plan to take it easy for the rest of today and you should NOT DRIVE or use heavy machinery until tomorrow (because of the sedation medicines used during the test).    FOLLOW UP: Our staff will call the number listed on your records the next business day following your procedure.  We will call around 7:15- 8:00 am to check on you and address any questions or concerns that you may have regarding the information given to you following your procedure. If we do not reach you, we will leave a message.     If any biopsies were taken you will be contacted by phone or by letter within the next 1-3 weeks.  Please call us at [phone] if you have not heard about the biopsies in 3 weeks.    SIGNATURES/CONFIDENTIALITY: You and/or your care partner have signed paperwork which will be entered into your electronic medical record.  These signatures attest to the fact that that the information above on your After Visit Summary has been reviewed and is understood.  Full responsibility of the confidentiality of this discharge information lies with you and/or your care-partner.

## 2022-11-28 NOTE — Op Note (Signed)
[facility] Patient Name: Rebecca Key Procedure Date: 11/28/2022 11:39 AM MRN: [id number] Endoscopist: Sherilyn Cooter L. Myrtie Neither , MD, [id number] Age: 52 Referring MD:  Date of Birth: 09-24-1970 Gender: Female Account #: [id number] Procedure:                Colonoscopy Indications:              Screening for colorectal malignant neoplasm, This                            is the patient's first colonoscopy Medicines:                Monitored Anesthesia Care Procedure:                Pre-Anesthesia Assessment:                           - Prior to the procedure, a History and Physical                            was performed, and patient medications and                            allergies were reviewed. The patient's tolerance of                            previous anesthesia was also reviewed. The risks                            and benefits of the procedure and the sedation                            options and risks were discussed with the patient.                            All questions were answered, and informed consent                            was obtained. Prior Anticoagulants: The patient has                            taken no anticoagulant or antiplatelet agents. ASA                            Grade Assessment: II - A patient with mild systemic                            disease. After reviewing the risks and benefits,                            the patient was deemed in satisfactory condition to                            undergo the procedure.  After obtaining informed consent, the colonoscope                            was passed under direct vision. Throughout the                            procedure, the patient's blood pressure, pulse, and                            oxygen saturations were monitored continuously. The                            Olympus CF-HQ190L ([id number]) Colonoscope was                            introduced through the  anus and advanced to the the                            cecum, identified by appendiceal orifice and                            ileocecal valve. The colonoscopy was performed                            without difficulty. The patient tolerated the                            procedure well. The quality of the bowel                            preparation was excellent. The ileocecal valve,                            appendiceal orifice, and rectum were photographed. Scope In: 11:48:31 AM Scope Out: 12:03:03 PM Scope Withdrawal Time: 0 hours 10 minutes 51 seconds  Total Procedure Duration: 0 hours 14 minutes 32 seconds  Findings:                 The perianal and digital rectal examinations were                            normal.                           Repeat examination of right colon under NBI                            performed.                           Internal hemorrhoids were found. The hemorrhoids                            were Grade I (internal hemorrhoids that do not  prolapse).                           The exam was otherwise without abnormality on                            direct and retroflexion views. Complications:            No immediate complications. Estimated Blood Loss:     Estimated blood loss: none. Impression:               - Internal hemorrhoids.                           - The examination was otherwise normal on direct                            and retroflexion views.                           - No specimens collected. Recommendation:           - Patient has a contact number available for                            emergencies. The signs and symptoms of potential                            delayed complications were discussed with the                            patient. Return to normal activities tomorrow.                            Written discharge instructions were provided to the                            patient.                            - Resume previous diet.                           - Continue present medications.                           - Repeat colonoscopy in 10 years for screening                            purposes. Rutha Melgoza L. Myrtie Neither, MD 11/28/2022 12:05:55 PM This report has been signed electronically.

## 2022-11-28 NOTE — Progress Notes (Signed)
Pt's states no medical or surgical changes since previsit or office visit. 

## 2022-11-29 ENCOUNTER — Telehealth: Payer: Self-pay

## 2022-11-29 NOTE — Telephone Encounter (Signed)
Left message on follow up call. 

## 2022-12-02 DIAGNOSIS — Z1331 Encounter for screening for depression: Secondary | ICD-10-CM | POA: Diagnosis not present

## 2022-12-02 DIAGNOSIS — Z124 Encounter for screening for malignant neoplasm of cervix: Secondary | ICD-10-CM | POA: Diagnosis not present

## 2022-12-02 DIAGNOSIS — Z1231 Encounter for screening mammogram for malignant neoplasm of breast: Secondary | ICD-10-CM | POA: Diagnosis not present

## 2022-12-02 DIAGNOSIS — Z01419 Encounter for gynecological examination (general) (routine) without abnormal findings: Secondary | ICD-10-CM | POA: Diagnosis not present

## 2022-12-02 LAB — HM MAMMOGRAPHY

## 2022-12-07 ENCOUNTER — Ambulatory Visit: Payer: BC Managed Care – PPO

## 2022-12-09 ENCOUNTER — Ambulatory Visit (INDEPENDENT_AMBULATORY_CARE_PROVIDER_SITE_OTHER): Payer: BC Managed Care – PPO

## 2022-12-09 DIAGNOSIS — Z23 Encounter for immunization: Secondary | ICD-10-CM

## 2022-12-09 NOTE — Progress Notes (Signed)
Pt here today for Shingrix #2 per Dr. Patsy Lager.   Shingrix 0.5mg  injected into R deltoid. Pt tolerated injection well.

## 2023-02-05 ENCOUNTER — Ambulatory Visit: Payer: BC Managed Care – PPO

## 2023-02-05 ENCOUNTER — Ambulatory Visit (HOSPITAL_COMMUNITY)
Admission: EM | Admit: 2023-02-05 | Discharge: 2023-02-05 | Disposition: A | Discharge status: Home / Self Care | Payer: BC Managed Care – PPO | Attending: Family Medicine | Admitting: Family Medicine

## 2023-02-05 ENCOUNTER — Encounter (HOSPITAL_COMMUNITY): Payer: Self-pay

## 2023-02-05 DIAGNOSIS — N3001 Acute cystitis with hematuria: Secondary | ICD-10-CM | POA: Diagnosis not present

## 2023-02-05 LAB — POCT URINALYSIS DIP (MANUAL ENTRY)
Bilirubin, UA: NEGATIVE
Glucose, UA: NEGATIVE mg/dL
Ketones, POC UA: NEGATIVE mg/dL
Nitrite, UA: NEGATIVE
Spec Grav, UA: >=1.03 — AB (ref 1.010–1.025)
Urobilinogen, UA: 0.2 U/dL
pH, UA: 5.5 (ref 5.0–8.0)

## 2023-02-05 MED ORDER — NITROFURANTOIN MONOHYD MACRO 100 MG PO CAPS: 100.0000 mg | ORAL_CAPSULE | Freq: Two times a day (BID) | ORAL | 0 refills | Status: DC

## 2023-02-05 NOTE — ED Provider Notes (Signed)
[facility]    CSN: [id number] Arrival date & time: 02/05/23  1744      History   Chief Complaint No chief complaint on file.   HPI Rebecca Key is a 53 y.o. female.   53 year old female who presents to urgent care with complaints of foul-smelling urine, dysuria and lower abdominal pressure with urination.  This has been going on for 3 days and does not seem to be getting any better.  She is trying to drink plenty of water.  She denies any history of having urinary tract infections in the past.  She denies any vaginal symptoms.     Past Medical History:  Diagnosis Date   Active labor 02/02/2011   Allergy    Anemia    with pregnancy   Chicken pox    Headache(784.0)    occ   History of fainting spells of unknown cause    Inguinal hernia    No pertinent past medical history    PP care - s/p SVD 1/16 02/03/2011   Vaginal delivery 2010, 2013    Patient Active Problem List   Diagnosis Date Noted   Basal cell carcinoma (BCC) in situ of skin 08/16/2021   Cystocele, grade 3 04/18/2013    Past Surgical History:  Procedure Laterality Date   BLADDER SUSPENSION N/A 04/18/2013   Procedure: TRANSVAGINAL TAPE (TVT) Exact PROCEDURE;  Surgeon: Robley Fries, MD;  Location: [facility];  Service: Gynecology;  Laterality: N/A;   CYSTOCELE REPAIR N/A 04/18/2013   Procedure: ANTERIOR REPAIR (CYSTOCELE); Anterior Colporrhaphy;  Surgeon: Robley Fries, MD;  Location: [facility];  Service: Gynecology;  Laterality: N/A;  90 min.   CYSTOSCOPY N/A 04/18/2013   Procedure: CYSTOSCOPY;  Surgeon: Robley Fries, MD;  Location: [facility];  Service: Gynecology;  Laterality: N/A;   HERNIA REPAIR     MANDIBLE FRACTURE SURGERY     RECTOCELE REPAIR  04/18/2013   Procedure: POSTERIOR REPAIR (RECTOCELE);  Surgeon: Robley Fries, MD;  Location: [facility];  Service: Gynecology;;   SHOULDER ARTHROSCOPY     VAGINAL PROLAPSE REPAIR  2014    OB History     Gravida  2   Para  2   Term  2   Preterm       AB      Living  2      SAB      IAB      Ectopic      Multiple      Live Births  1            Home Medications    Prior to Admission medications   Medication Sig Start Date End Date Taking? Authorizing Provider  levonorgestrel (MIRENA) 20 MCG/24HR IUD 1 each by Intrauterine route once.    [provider]  nitrofurantoin, macrocrystal-monohydrate, (MACROBID) 100 MG capsule Take 1 capsule (100 mg total) by mouth 2 (two) times daily. 02/05/23   Landis Martins, PA-C    Family History Family History  Problem Relation Age of Onset   Heart disease Mother    Stroke Mother    Arthritis Mother    Hyperlipidemia Mother    Intellectual disability Mother    Hyperlipidemia Father    Heart disease Maternal Grandmother    Cancer Maternal Grandfather    Cancer Paternal Grandmother    Hearing loss Paternal Grandfather    Colon cancer Neg Hx    Colon polyps Neg Hx    Esophageal cancer  Neg Hx    Rectal cancer Neg Hx    Stomach cancer Neg Hx     Social History Social History   Tobacco Use   Smoking status: Never   Smokeless tobacco: Never  Vaping Use   Vaping status: Never Used  Substance Use Topics   Alcohol use: Yes    Comment: occassional   Drug use: No     Allergies   Sulfa antibiotics and Sulfamethoxazole   Review of Systems Review of Systems  Constitutional:  Negative for chills and fever.  HENT:  Negative for ear pain and sore throat.   Eyes:  Negative for pain and visual disturbance.  Respiratory:  Negative for cough and shortness of breath.   Cardiovascular:  Negative for chest pain and palpitations.  Gastrointestinal:  Negative for abdominal pain and vomiting.  Genitourinary:  Positive for dysuria. Negative for hematuria.       Foul odor to the urine and pressure in the lower abdomen  Musculoskeletal:  Negative for arthralgias and back pain.  Skin:  Negative for color change and rash.  Neurological:  Negative for seizures and  syncope.  All other systems reviewed and are negative.    Physical Exam Triage Vital Signs ED Triage Vitals  Encounter Vitals Group     BP 02/05/23 1800 116/81     Systolic BP Percentile --      Diastolic BP Percentile --      Pulse Rate 02/05/23 1800 73     Resp 02/05/23 1800 20     Temp 02/05/23 1800 98.2 F (36.8 C)     Temp Source 02/05/23 1800 Oral     SpO2 02/05/23 1800 98 %     Weight --      Height --      Head Circumference --      Peak Flow --      Pain Score 02/05/23 1801 5     Pain Loc --      Pain Education --      Exclude from Growth Chart --    No data found.  Updated Vital Signs BP 116/81 (BP Location: Right Arm)   Pulse 73   Temp 98.2 F (36.8 C) (Oral)   Resp 20   LMP 02/04/2023   SpO2 98%   Visual Acuity Right Eye Distance:   Left Eye Distance:   Bilateral Distance:    Right Eye Near:   Left Eye Near:    Bilateral Near:     Physical Exam Vitals and nursing note reviewed.  Constitutional:      General: She is not in acute distress.    Appearance: She is well-developed.  HENT:     Head: Normocephalic and atraumatic.  Eyes:     Conjunctiva/sclera: Conjunctivae normal.  Cardiovascular:     Rate and Rhythm: Normal rate and regular rhythm.     Heart sounds: No murmur heard. Pulmonary:     Effort: Pulmonary effort is normal. No respiratory distress.     Breath sounds: Normal breath sounds.  Abdominal:     Palpations: Abdomen is soft.     Tenderness: There is no abdominal tenderness.  Musculoskeletal:        General: No swelling.     Cervical back: Neck supple.  Skin:    General: Skin is warm and dry.     Capillary Refill: Capillary refill takes less than 2 seconds.  Neurological:     Mental Status: She is alert.  Psychiatric:  Mood and Affect: Mood normal.      UC Treatments / Results  Labs (all labs ordered are listed, but only abnormal results are displayed) Labs Reviewed  POCT URINALYSIS DIP (MANUAL ENTRY) -  Abnormal; Notable for the following components:      Result Value   Spec Grav, UA >=1.030 (*)    Blood, UA large (*)    Protein Ur, POC trace (*)    Leukocytes, UA Small (1+) (*)    All other components within normal limits    EKG   Radiology No results found.  Procedures Procedures (including critical care time)  Medications Ordered in UC Medications - No data to display  Initial Impression / Assessment and Plan / UC Course  I have reviewed the triage vital signs and the nursing notes.  Pertinent labs & imaging results that were available during my care of the patient were reviewed by me and considered in my medical decision making (see chart for details).     Acute cystitis with hematuria   Urinalysis shows an urinary tract infection. We will treat with the following:  Macrobid 100 mg twice daily for 5 days. Drink plenty of water Return to urgent care or PCP if symptoms worsen or fail to resolve.    Final Clinical Impressions(s) / UC Diagnoses   Final diagnoses:  Acute cystitis with hematuria     Discharge Instructions      Urinalysis shows an urinary tract infection. We will treat with the following:  Macrobid 100 mg twice daily for 5 days. Drink plenty of water Return to urgent care or PCP if symptoms worsen or fail to resolve.    Urinalysis    Component Value Date/Time   COLORURINE YELLOW 04/15/2013 1520   APPEARANCEUR CLEAR 04/15/2013 1520   LABSPEC 1.010 04/15/2013 1520   PHURINE 6.5 04/15/2013 1520   GLUCOSEU NEGATIVE 04/15/2013 1520   HGBUR LARGE (A) 04/15/2013 1520   BILIRUBINUR negative 02/05/2023 1816   KETONESUR negative 02/05/2023 1816   KETONESUR NEGATIVE 04/15/2013 1520   PROTEINUR trace (A) 02/05/2023 1816   PROTEINUR NEGATIVE 04/15/2013 1520   UROBILINOGEN 0.2 02/05/2023 1816   UROBILINOGEN 0.2 04/15/2013 1520   NITRITE Negative 02/05/2023 1816   NITRITE NEGATIVE 04/15/2013 1520   LEUKOCYTESUR Small (1+) (A) 02/05/2023 1816        ED Prescriptions     Medication Sig Dispense Auth. Provider   nitrofurantoin, macrocrystal-monohydrate, (MACROBID) 100 MG capsule  (Status: Discontinued) Take 1 capsule (100 mg total) by mouth 2 (two) times daily. 10 capsule Ruba Outen A, PA-C   nitrofurantoin, macrocrystal-monohydrate, (MACROBID) 100 MG capsule Take 1 capsule (100 mg total) by mouth 2 (two) times daily. 10 capsule Landis Martins, [place]      PDMP not reviewed this encounter.   Landis Martins, [place] 02/05/23 1847

## 2023-02-05 NOTE — Discharge Instructions (Addendum)
Urinalysis shows an urinary tract infection. We will treat with the following:  Macrobid 100 mg twice daily for 5 days. Drink plenty of water Return to urgent care or PCP if symptoms worsen or fail to resolve.    Urinalysis    Component Value Date/Time   COLORURINE YELLOW 04/15/2013 1520   APPEARANCEUR CLEAR 04/15/2013 1520   LABSPEC 1.010 04/15/2013 1520   PHURINE 6.5 04/15/2013 1520   GLUCOSEU NEGATIVE 04/15/2013 1520   HGBUR LARGE (A) 04/15/2013 1520   BILIRUBINUR negative 02/05/2023 1816   KETONESUR negative 02/05/2023 1816   KETONESUR NEGATIVE 04/15/2013 1520   PROTEINUR trace (A) 02/05/2023 1816   PROTEINUR NEGATIVE 04/15/2013 1520   UROBILINOGEN 0.2 02/05/2023 1816   UROBILINOGEN 0.2 04/15/2013 1520   NITRITE Negative 02/05/2023 1816   NITRITE NEGATIVE 04/15/2013 1520   LEUKOCYTESUR Small (1+) (A) 02/05/2023 1816

## 2023-02-05 NOTE — ED Triage Notes (Signed)
Pt reports she has pain and pressure with urination, foul odor x 3 days

## 2023-10-10 NOTE — Patient Instructions (Incomplete)
 It was great to see you today, I will be in touch with your labs to soon as possible Consider COVID-19 booster this fall at your discretion Also flu shot at your convenience Take care!  Let me know if you want to see sports med about your elbow

## 2023-10-10 NOTE — Progress Notes (Unsigned)
 [facility] [address] [place], [place] [address] [phone]  Date:  10/11/2023   Name:  Rebecca Key   DOB:  September 09, 1970   MRN:  [id number]  PCP:  Watt Harlene BROCKS, MD    Chief Complaint: No chief complaint on file.   History of Present Illness:  Rebecca Key is a 53 y.o. very pleasant female patient who presents with the following:  Patient seen today for physical exam.  I saw her most recently about 1 year ago She has history of skin cancer, vitamin D  deficiency She also lost her husband [place]ford in summer 2023 due to colon cancer She has 2 sons, ages 73 and 35  Pap smear Mammogram November 2024 Colon cancer screening-she had a colonoscopy November 2024 per Dr. Legrand Flu shot COVID booster Shingrix  Lab work-1 year ago, can update today   Discussed the use of AI scribe software for clinical note transcription with the patient, who gave verbal consent to proceed.  History of Present Illness    Patient Active Problem List   Diagnosis Date Noted   Basal cell carcinoma (BCC) in situ of skin 08/16/2021   Cystocele, grade 3 04/18/2013    Past Medical History:  Diagnosis Date   Active labor 02/02/2011   Allergy    Anemia    with pregnancy   Chicken pox    Headache(784.0)    occ   History of fainting spells of unknown cause    Inguinal hernia    No pertinent past medical history    PP care - s/p SVD 1/16 02/03/2011   Vaginal delivery 2010, 2013    Past Surgical History:  Procedure Laterality Date   BLADDER SUSPENSION N/A 04/18/2013   Procedure: TRANSVAGINAL TAPE (TVT) Exact PROCEDURE;  Surgeon: Robbi JONELLE Render, MD;  Location: [facility];  Service: Gynecology;  Laterality: N/A;   CYSTOCELE REPAIR N/A 04/18/2013   Procedure: ANTERIOR REPAIR (CYSTOCELE); Anterior Colporrhaphy;  Surgeon: Robbi JONELLE Render, MD;  Location: [facility];  Service: Gynecology;  Laterality: N/A;  90 min.   CYSTOSCOPY N/A  04/18/2013   Procedure: CYSTOSCOPY;  Surgeon: Robbi JONELLE Render, MD;  Location: [facility];  Service: Gynecology;  Laterality: N/A;   HERNIA REPAIR     MANDIBLE FRACTURE SURGERY     RECTOCELE REPAIR  04/18/2013   Procedure: POSTERIOR REPAIR (RECTOCELE);  Surgeon: Robbi JONELLE Render, MD;  Location: [facility];  Service: Gynecology;;   SHOULDER ARTHROSCOPY     VAGINAL PROLAPSE REPAIR  2014    Social History   Tobacco Use   Smoking status: Never   Smokeless tobacco: Never  Vaping Use   Vaping status: Never Used  Substance Use Topics   Alcohol use: Yes    Comment: occassional   Drug use: No    Family History  Problem Relation Age of Onset   Heart disease Mother    Stroke Mother    Arthritis Mother    Hyperlipidemia Mother    Intellectual disability Mother    Hyperlipidemia Father    Heart disease Maternal Grandmother    Cancer Maternal Grandfather    Cancer Paternal Grandmother    Hearing loss Paternal Grandfather    Colon cancer Neg Hx    Colon polyps Neg Hx    Esophageal cancer Neg Hx    Rectal cancer Neg Hx    Stomach cancer Neg Hx     Allergies  Allergen Reactions  Sulfa Antibiotics Swelling   Sulfamethoxazole Swelling    Medication list has been reviewed and updated.  Current Outpatient Medications on File Prior to Visit  Medication Sig Dispense Refill   levonorgestrel (MIRENA) 20 MCG/24HR IUD 1 each by Intrauterine route once.     nitrofurantoin , macrocrystal-monohydrate, (MACROBID ) 100 MG capsule Take 1 capsule (100 mg total) by mouth 2 (two) times daily. 10 capsule 0   No current facility-administered medications on file prior to visit.    Review of Systems:  As per HPI- otherwise negative.   Physical Examination: There were no vitals filed for this visit. There were no vitals filed for this visit. There is no height or weight on file to calculate BMI. Ideal Body Weight:    GEN: no acute distress. HEENT: Atraumatic, Normocephalic.  Ears and Nose: No external  deformity. CV: RRR, No M/G/R. No JVD. No thrill. No extra heart sounds. PULM: CTA B, no wheezes, crackles, rhonchi. No retractions. No resp. distress. No accessory muscle use. ABD: S, NT, ND, +BS. No rebound. No HSM. EXTR: No c/c/e PSYCH: Normally interactive. Conversant.    Assessment and Plan: No diagnosis found.  Assessment & Plan   Signed Harlene Schroeder, MD

## 2023-10-11 ENCOUNTER — Encounter: Payer: Self-pay | Admitting: Family Medicine

## 2023-10-11 ENCOUNTER — Ambulatory Visit (INDEPENDENT_AMBULATORY_CARE_PROVIDER_SITE_OTHER): Admitting: Family Medicine

## 2023-10-11 VITALS — BP 118/74 | HR 69 | Temp 97.9°F | Ht 67.0 in | Wt 184.6 lb

## 2023-10-11 DIAGNOSIS — M21612 Bunion of left foot: Secondary | ICD-10-CM

## 2023-10-11 DIAGNOSIS — E559 Vitamin D deficiency, unspecified: Secondary | ICD-10-CM | POA: Diagnosis not present

## 2023-10-11 DIAGNOSIS — Z0000 Encounter for general adult medical examination without abnormal findings: Secondary | ICD-10-CM | POA: Diagnosis not present

## 2023-10-11 DIAGNOSIS — M7711 Lateral epicondylitis, right elbow: Secondary | ICD-10-CM

## 2023-10-11 DIAGNOSIS — R7303 Prediabetes: Secondary | ICD-10-CM | POA: Insufficient documentation

## 2023-10-11 DIAGNOSIS — Z23 Encounter for immunization: Secondary | ICD-10-CM

## 2023-10-11 DIAGNOSIS — Z13220 Encounter for screening for lipoid disorders: Secondary | ICD-10-CM | POA: Diagnosis not present

## 2023-10-11 DIAGNOSIS — Z131 Encounter for screening for diabetes mellitus: Secondary | ICD-10-CM | POA: Diagnosis not present

## 2023-10-11 DIAGNOSIS — Z1329 Encounter for screening for other suspected endocrine disorder: Secondary | ICD-10-CM | POA: Diagnosis not present

## 2023-10-11 DIAGNOSIS — Z130 Encounter for screening for diseases of the blood and blood-forming organs and certain disorders involving the immune mechanism: Secondary | ICD-10-CM | POA: Diagnosis not present

## 2023-10-11 DIAGNOSIS — E663 Overweight: Secondary | ICD-10-CM

## 2023-10-11 LAB — LIPID PANEL
Cholesterol: 232 mg/dL — ABNORMAL HIGH (ref 0–200)
HDL: 53.3 mg/dL (ref >39.00)
LDL Cholesterol: 152 mg/dL — ABNORMAL HIGH (ref 0–99)
NonHDL: 178.69
Total CHOL/HDL Ratio: 4
Triglycerides: 133 mg/dL (ref 0.0–149.0)
VLDL: 26.6 mg/dL (ref 0.0–40.0)

## 2023-10-11 LAB — CBC
HCT: 44.3 % (ref 36.0–46.0)
Hemoglobin: 14.8 g/dL (ref 12.0–15.0)
MCHC: 33.5 g/dL (ref 30.0–36.0)
MCV: 89.8 fl (ref 78.0–100.0)
Platelets: 226 K/uL (ref 150.0–400.0)
RBC: 4.93 Mil/uL (ref 3.87–5.11)
RDW: 13 % (ref 11.5–15.5)
WBC: 5.8 K/uL (ref 4.0–10.5)

## 2023-10-11 LAB — COMPREHENSIVE METABOLIC PANEL WITH GFR
ALT: 8 U/L (ref 0–35)
AST: 21 U/L (ref 0–37)
Albumin: 4.4 g/dL (ref 3.5–5.2)
Alkaline Phosphatase: 69 U/L (ref 39–117)
BUN: 10 mg/dL (ref 6–23)
CO2: 31 meq/L (ref 19–32)
Calcium: 9.2 mg/dL (ref 8.4–10.5)
Chloride: 101 meq/L (ref 96–112)
Creatinine, Ser: 0.78 mg/dL (ref 0.40–1.20)
GFR: 88.07 mL/min (ref >60.00)
Glucose, Bld: 97 mg/dL (ref 70–99)
Potassium: 3.8 meq/L (ref 3.5–5.1)
Sodium: 140 meq/L (ref 135–145)
Total Bilirubin: 0.7 mg/dL (ref 0.2–1.2)
Total Protein: 6.9 g/dL (ref 6.0–8.3)

## 2023-10-11 LAB — HEMOGLOBIN A1C: Hgb A1c MFr Bld: 6.1 % (ref 4.6–6.5)

## 2023-10-11 LAB — VITAMIN D 25 HYDROXY (VIT D DEFICIENCY, FRACTURES): VITD: 13.77 ng/mL — ABNORMAL LOW (ref 30.00–100.00)

## 2023-10-11 LAB — TSH: TSH: 2.03 u[IU]/mL (ref 0.35–5.50)

## 2023-10-11 MED ORDER — VITAMIN D3 1.25 MG (50000 UT) PO CAPS: ORAL_CAPSULE | ORAL | 0 refills | Status: DC

## 2023-10-11 MED ORDER — COVID-19 MRNA VAC-TRIS(PFIZER) 30 MCG/0.3ML IM SUSY: 0.3000 mL | PREFILLED_SYRINGE | Freq: Once | INTRAMUSCULAR | 0 refills | Status: AC

## 2023-10-21 DIAGNOSIS — Z23 Encounter for immunization: Secondary | ICD-10-CM | POA: Diagnosis not present

## 2023-10-26 ENCOUNTER — Encounter (INDEPENDENT_AMBULATORY_CARE_PROVIDER_SITE_OTHER): Payer: Self-pay

## 2023-11-30 DIAGNOSIS — B078 Other viral warts: Secondary | ICD-10-CM | POA: Diagnosis not present

## 2023-11-30 DIAGNOSIS — L820 Inflamed seborrheic keratosis: Secondary | ICD-10-CM | POA: Diagnosis not present

## 2023-11-30 DIAGNOSIS — L814 Other melanin hyperpigmentation: Secondary | ICD-10-CM | POA: Diagnosis not present

## 2023-11-30 DIAGNOSIS — Z85828 Personal history of other malignant neoplasm of skin: Secondary | ICD-10-CM | POA: Diagnosis not present

## 2023-11-30 DIAGNOSIS — L821 Other seborrheic keratosis: Secondary | ICD-10-CM | POA: Diagnosis not present

## 2024-02-21 ENCOUNTER — Ambulatory Visit: Admitting: Student

## 2024-02-21 ENCOUNTER — Ambulatory Visit: Admitting: Family

## 2024-02-21 ENCOUNTER — Other Ambulatory Visit (HOSPITAL_BASED_OUTPATIENT_CLINIC_OR_DEPARTMENT_OTHER): Payer: Self-pay

## 2024-02-21 VITALS — BP 121/75 | HR 73 | Temp 97.7°F | Resp 16 | Ht 67.0 in | Wt 187.0 lb

## 2024-02-21 DIAGNOSIS — R3915 Urgency of urination: Secondary | ICD-10-CM

## 2024-02-21 DIAGNOSIS — Z1231 Encounter for screening mammogram for malignant neoplasm of breast: Secondary | ICD-10-CM

## 2024-02-21 DIAGNOSIS — N3000 Acute cystitis without hematuria: Secondary | ICD-10-CM | POA: Insufficient documentation

## 2024-02-21 DIAGNOSIS — Z975 Presence of (intrauterine) contraceptive device: Secondary | ICD-10-CM | POA: Insufficient documentation

## 2024-02-21 DIAGNOSIS — M2550 Pain in unspecified joint: Secondary | ICD-10-CM | POA: Insufficient documentation

## 2024-02-21 LAB — POC URINALSYSI DIPSTICK (AUTOMATED)
Bilirubin, UA: NEGATIVE
Glucose, UA: NEGATIVE
Ketones, UA: NEGATIVE
Nitrite, UA: NEGATIVE
Protein, UA: NEGATIVE
Spec Grav, UA: <=1.005 — AB
Urobilinogen, UA: 0.2 U/dL
pH, UA: 6.5

## 2024-02-21 LAB — SEDIMENTATION RATE: Sed Rate: 6 mm/h (ref 0–30)

## 2024-02-21 MED ORDER — NITROFURANTOIN MONOHYD MACRO 100 MG PO CAPS
100.0000 mg | ORAL_CAPSULE | Freq: Two times a day (BID) | ORAL | 0 refills | Status: AC
  Filled 2024-02-21: qty 10, 5d supply, fill #0

## 2024-02-21 NOTE — Progress Notes (Signed)
 "  Acute Office Visit  Subjective:     Patient ID: Rebecca Key, female    DOB: 07/18/1970, 54 y.o.   MRN: [id number]  Chief Complaint  Patient presents with   Urinary Urgency    Patient reports feeling urinary urgency and bladder pressure.    HPI Patient in office today for constant lower abdominal pain she describes as pressure 3/10. She feels confident she is getting a UTI as this is the same symptoms she had exactly one year ago and wonders if this will be a yearly occurrence. She has not tried anything for the pain. She reports difficulty getting enough water  in  as she prefers coffee, juice, and tea in the cold weather. Reports aching all over that she thinks may be because she is perimenopausal.  Review of Systems  Constitutional: Negative.   Respiratory: Negative.    Cardiovascular: Negative.   Gastrointestinal:  Positive for abdominal pain.  Genitourinary:  Positive for dysuria and urgency. Negative for hematuria.       Reports not seeing any blood in urine  Musculoskeletal:  Positive for myalgias.       Reports aching all over joints and muscles   Psychiatric/Behavioral: Negative.        Objective:    BP 121/75 (BP Location: Right Arm, Patient Position: Sitting, Cuff Size: Normal)   Pulse 73   Temp 97.7 F (36.5 C) (Oral)   Resp 16   Ht 5' 7 (1.702 m)   Wt 187 lb (84.8 kg)   SpO2 99%   BMI 29.29 kg/m   Physical Exam Constitutional:      Appearance: Normal appearance.  HENT:     Head: Normocephalic.  Cardiovascular:     Rate and Rhythm: Normal rate and regular rhythm.     Heart sounds: Normal heart sounds.  Pulmonary:     Effort: Pulmonary effort is normal.     Breath sounds: Normal breath sounds.  Neurological:     Mental Status: She is alert and oriented to person, place, and time.  Psychiatric:        Mood and Affect: Mood normal.        Behavior: Behavior normal.    Results for orders placed or performed in visit on 02/21/24  POCT Urinalysis  Dipstick (Automated)  Result Value Ref Range   Color, UA yellow    Clarity, UA cloudy    Glucose, UA Negative Negative   Bilirubin, UA neg    Ketones, UA neg    Spec Grav, UA <=1.005 (A) 1.010 - 1.025   Blood, UA large    pH, UA 6.5 5.0 - 8.0   Protein, UA Negative Negative   Urobilinogen, UA 0.2 0.2 or 1.0 E.U./dL   Nitrite, UA neg    Leukocytes, UA Large (3+) (A) Negative      Assessment & Plan:  Uncomplicated Cystitis -POCT urine dip= +blood (large), +leukocytes (large 3+), but negative nitrites will send for culture to ensure proper antibiotic coverage  -Macrobid  commonly used to treat uncomplicated urinary tract infections as it penetrates the urine and bladder well-patient prefers this over keflex  option  -encouraged fluid intake to flush bacteria out, dilute urine, prevent urinary stasis, and reduce risk of UTI ascending upward to the kidneys  -instructed patient on proper wiping technique. Reminded patient to wipe front to back in an effort to not bring bacteria forward from the anus to the ureter.   Polyarthralgias -ANA -rheumaoid factor  Common causes of polyarthralgia's are  autoimmune in nature    --sedimentation rate  Checking for inflammation in the body   Preventative care -Mammogram referral USPSTF guidelines screen starting at age 33 every other year last completed in 2024.   Follow up in 6 months or sooner if symptoms worsen or do no improve  [employer], FNP-student    "

## 2024-02-21 NOTE — Patient Instructions (Signed)
" °  VISIT SUMMARY: During your visit, we discussed your symptoms of a urinary tract infection and generalized body aches. We also reviewed your overall health maintenance, including scheduling a mammogram and routine gynecological care.  YOUR PLAN: -ACUTE CYSTITIS: Acute cystitis is an infection of the bladder. Based on your symptoms and urinalysis, we suspect you have a urinary tract infection. We have prescribed Macrobid  and sent your urine for a culture to confirm the infection and guide antibiotic treatment. Please pick up your prescription from the pharmacy.  -POLYARTHRALGIA: Polyarthralgia refers to generalized joint pain. Your body aches could be due to arthritis or an autoimmune condition, though hormonal changes are less likely. We have ordered blood tests to screen for autoimmune conditions such as rheumatoid arthritis and lupus. Please follow up with your gynecologist to discuss your IUD.  -GENERAL HEALTH MAINTENANCE: We discussed the importance of scheduling a mammogram and maintaining routine gynecological care. We have ordered a mammogram for you and advised you to follow up with your gynecologist for routine care and IUD management.  INSTRUCTIONS: Please pick up your Macrobid  prescription from the pharmacy. Follow up with your gynecologist to discuss your IUD and routine care. Additionally, schedule your mammogram as ordered. Await the results of your urine culture and autoimmune blood tests, and we will provide further guidance based on those results.    Contains text generated by [employer].   "

## 2024-02-21 NOTE — Assessment & Plan Note (Signed)
" °  Symptoms and urinalysis indicate urinary tract infection. Awaiting culture results for confirmation and antibiotic guidance. - Prescribed Macrobid . - Sent urine culture for confirmation and antibiotic guidance. - Advised to pick up prescription from pharmacy.  "

## 2024-02-21 NOTE — Assessment & Plan Note (Signed)
" °  Generalized aching possibly due to arthritis or autoimmune conditions. Hormonal changes unlikely. - Ordered autoimmune blood tests: rheumatoid screen, lupus screen, sed rate. "

## 2024-02-21 NOTE — Progress Notes (Signed)
 "  Subjective:     Patient ID: Rebecca Key, female    DOB: 03-21-1970, 54 y.o.   MRN: [id number]  Chief Complaint  Patient presents with   Urinary Urgency    Patient reports feeling urinary urgency and bladder pressure.     HPI  Discussed the use of AI scribe software for clinical note transcription with the patient, who gave verbal consent to proceed.  History of Present Illness Rebecca Key is a 54 year old female who presents with symptoms suggestive of a urinary tract infection.  She has been experiencing constant pain rated at 3 out of 10, which began yesterday. Her urine is dark yellow, which she attributes to high caffeine intake from tea and coffee, as well as insufficient water  consumption due to being at home during snowy weather. She is concerned about the recurrence of these symptoms, as she experienced a similar episode one year ago that required an emergency room visit.  She reports generalized aching throughout her body for several months, which she initially attributed to perimenopause. She is interested in being tested for perimenopause. She has a Mirena IUD, which she believes was inserted approximately thirteen years ago, after the birth of her youngest child.  No fevers. She is curious about whether her generalized aching could be related to perimenopause, although she has not experienced other common menopausal symptoms such as hot flashes or night sweats. She notes that she has not discussed menopause with her mother, who is deceased, and reflects on generational differences in discussing such topics.    Health Maintenance Due  Topic Date Due   Hepatitis B Vaccines 19-59 Average Risk (1 of 3 - 19+ 3-dose series) Never done   Cervical Cancer Screening (HPV/Pap Cotest)  02/05/2018   Pneumococcal Vaccine: 50+ Years (1 of 1 - PCV) Never done   Mammogram  12/02/2023    Past Medical History:  Diagnosis Date   Active labor 02/02/2011   Allergy    Anemia     with pregnancy   Chicken pox    Headache(784.0)    occ   History of fainting spells of unknown cause    Inguinal hernia    No pertinent past medical history    PP care - s/p SVD 1/16 02/03/2011   Vaginal delivery 2010, 2013    Past Surgical History:  Procedure Laterality Date   BLADDER SUSPENSION N/A 04/18/2013   Procedure: TRANSVAGINAL TAPE (TVT) Exact PROCEDURE;  Surgeon: Robbi JONELLE Render, MD;  Location: [facility];  Service: Gynecology;  Laterality: N/A;   CYSTOCELE REPAIR N/A 04/18/2013   Procedure: ANTERIOR REPAIR (CYSTOCELE); Anterior Colporrhaphy;  Surgeon: Robbi JONELLE Render, MD;  Location: [facility];  Service: Gynecology;  Laterality: N/A;  90 min.   CYSTOSCOPY N/A 04/18/2013   Procedure: CYSTOSCOPY;  Surgeon: Robbi JONELLE Render, MD;  Location: [facility];  Service: Gynecology;  Laterality: N/A;   HERNIA REPAIR     MANDIBLE FRACTURE SURGERY     RECTOCELE REPAIR  04/18/2013   Procedure: POSTERIOR REPAIR (RECTOCELE);  Surgeon: Robbi JONELLE Render, MD;  Location: [facility];  Service: Gynecology;;   SHOULDER ARTHROSCOPY     VAGINAL PROLAPSE REPAIR  2014    Family History  Problem Relation Age of Onset   Heart disease Mother    Stroke Mother    Arthritis Mother    Hyperlipidemia Mother    Intellectual disability Mother    Hyperlipidemia Father    Heart disease Maternal Grandmother    Cancer  Maternal Grandfather    Cancer Paternal Grandmother    Hearing loss Paternal Grandfather    Colon cancer Neg Hx    Colon polyps Neg Hx    Esophageal cancer Neg Hx    Rectal cancer Neg Hx    Stomach cancer Neg Hx     Social History   Socioeconomic History   Marital status: Unknown    Spouse name: Not on file   Number of children: Not on file   Years of education: Not on file   Highest education level: Not on file  Occupational History   Not on file  Tobacco Use   Smoking status: Never   Smokeless tobacco: Never  Vaping Use   Vaping status: Never Used  Substance and Sexual Activity   Alcohol use:  Yes    Comment: occassional   Drug use: No   Sexual activity: Yes    Birth control/protection: I.U.D.  Other Topics Concern   Not on file  Social History Narrative   Not on file   Social Drivers of Health   Tobacco Use: Low Risk (10/11/2023)   Patient History    Smoking Tobacco Use: Never    Smokeless Tobacco Use: Never    Passive Exposure: Not on file  Financial Resource Strain: Not on file  Food Insecurity: Not on file  Transportation Needs: Not on file  Physical Activity: Not on file  Stress: Not on file  Social Connections: Not on file  Intimate Partner Violence: Not on file  Depression (PHQ2-9): Low Risk (10/11/2023)   Depression (PHQ2-9)    PHQ-2 Score: 1  Alcohol Screen: Not on file  Housing: Not on file  Utilities: Not on file  Health Literacy: Not on file    Outpatient Medications Prior to Visit  Medication Sig Dispense Refill   levonorgestrel (MIRENA) 20 MCG/24HR IUD 1 each by Intrauterine route once.     Cholecalciferol (VITAMIN D3) 1.25 MG (50000 UT) CAPS Take 1 weekly for 12 weeks 12 capsule 0   nitrofurantoin , macrocrystal-monohydrate, (MACROBID ) 100 MG capsule Take 1 capsule (100 mg total) by mouth 2 (two) times daily. (Patient not taking: Reported on 10/11/2023) 10 capsule 0   No facility-administered medications prior to visit.    Allergies[1]  ROS See HPI    Objective:    Physical Exam Constitutional:      General: She is not in acute distress.    Appearance: Normal appearance. She is well-developed.  HENT:     Head: Normocephalic and atraumatic.     Right Ear: External ear normal.     Left Ear: External ear normal.  Eyes:     General: No scleral icterus. Neck:     Thyroid : No thyromegaly.  Cardiovascular:     Rate and Rhythm: Normal rate and regular rhythm.     Heart sounds: Normal heart sounds. No murmur heard. Pulmonary:     Effort: Pulmonary effort is normal. No respiratory distress.     Breath sounds: Normal breath sounds. No  wheezing.  Musculoskeletal:     Cervical back: Neck supple.  Skin:    General: Skin is warm and dry.  Neurological:     Mental Status: She is alert and oriented to person, place, and time.  Psychiatric:        Mood and Affect: Mood normal.        Behavior: Behavior normal.        Thought Content: Thought content normal.        Judgment:  Judgment normal.      BP 121/75 (BP Location: Right Arm, Patient Position: Sitting, Cuff Size: Normal)   Pulse 73   Temp 97.7 F (36.5 C) (Oral)   Resp 16   Ht 5' 7 (1.702 m)   Wt 187 lb (84.8 kg)   SpO2 99%   BMI 29.29 kg/m  Wt Readings from Last 3 Encounters:  02/21/24 187 lb (84.8 kg)  10/11/23 184 lb 9.6 oz (83.7 kg)  11/28/22 185 lb 9.6 oz (84.2 kg)       Assessment & Plan:   Problem List Items Addressed This Visit       Unprioritized   Polyarthralgia    Generalized aching possibly due to arthritis or autoimmune conditions. Hormonal changes unlikely. - Ordered autoimmune blood tests: rheumatoid screen, lupus screen, sed rate.      Relevant Orders   Antinuclear Antib (ANA)   Sedimentation rate   Rheumatoid Factor   IUD (intrauterine device) in place    - Advised follow-up with gynecologist for IUD discussion (replace versus remove as she is 54 years old).  Her IUD has been in place for 10-13 years and she believes that she has a mirena.         Acute cystitis without hematuria    Symptoms and urinalysis indicate urinary tract infection. Awaiting culture results for confirmation and antibiotic guidance. - Prescribed Macrobid . - Sent urine culture for confirmation and antibiotic guidance. - Advised to pick up prescription from pharmacy.       Relevant Medications   nitrofurantoin , macrocrystal-monohydrate, (MACROBID ) 100 MG capsule   Other Visit Diagnoses       Urinary urgency    -  Primary   Relevant Orders   POCT Urinalysis Dipstick (Automated) (Completed)   Urine Culture     Breast cancer screening by  mammogram       Relevant Orders   MM 3D SCREENING MAMMOGRAM BILATERAL BREAST       Assessment & Plan    I have discontinued Presleigh Z. Slater's nitrofurantoin  (macrocrystal-monohydrate) and Vitamin D3. I am also having her start on nitrofurantoin  (macrocrystal-monohydrate). Additionally, I am having her maintain her levonorgestrel.  Meds ordered this encounter  Medications   nitrofurantoin , macrocrystal-monohydrate, (MACROBID ) 100 MG capsule    Sig: Take 1 capsule (100 mg total) by mouth 2 (two) times daily.    Dispense:  10 capsule    Refill:  0    Supervising Provider:   DOMENICA BLACKBIRD A [[id number]]      [1]  Allergies Allergen Reactions   Sulfa Antibiotics Swelling   Sulfamethoxazole Swelling   "

## 2024-02-21 NOTE — Assessment & Plan Note (Signed)
" °-   Advised follow-up with gynecologist for IUD discussion (replace versus remove as she is 54 years old).  Her IUD has been in place for 10-13 years and she believes that she has a mirena.    "

## 2024-02-22 LAB — URINE CULTURE
MICRO NUMBER:: 17548162
Result:: NO GROWTH
SPECIMEN QUALITY:: ADEQUATE

## 2024-02-22 LAB — RHEUMATOID FACTOR: Rheumatoid fact SerPl-aCnc: <10 [IU]/mL

## 2024-02-23 ENCOUNTER — Ambulatory Visit: Payer: Self-pay | Admitting: Family

## 2024-04-10 ENCOUNTER — Ambulatory Visit: Admitting: Family Medicine

## 2024-05-02 ENCOUNTER — Ambulatory Visit (HOSPITAL_BASED_OUTPATIENT_CLINIC_OR_DEPARTMENT_OTHER)
